# Patient Record
Sex: Male | Born: 1995 | Hispanic: No | Smoking: Never smoker
Health system: Southern US, Community
[De-identification: ages and names within clinical notes are randomized; demographics above are authoritative.]

---
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
## 2016-01-02 DIAGNOSIS — S83422D Sprain of lateral collateral ligament of left knee, subsequent encounter: Secondary | ICD-10-CM | POA: Diagnosis not present

## 2016-01-04 DIAGNOSIS — M25662 Stiffness of left knee, not elsewhere classified: Secondary | ICD-10-CM | POA: Diagnosis not present

## 2016-01-04 DIAGNOSIS — S8332XD Tear of articular cartilage of left knee, current, subsequent encounter: Secondary | ICD-10-CM | POA: Diagnosis not present

## 2016-01-04 DIAGNOSIS — M25562 Pain in left knee: Secondary | ICD-10-CM | POA: Diagnosis not present

## 2016-01-04 DIAGNOSIS — S83422D Sprain of lateral collateral ligament of left knee, subsequent encounter: Secondary | ICD-10-CM | POA: Diagnosis not present

## 2016-01-13 DIAGNOSIS — S83422D Sprain of lateral collateral ligament of left knee, subsequent encounter: Secondary | ICD-10-CM | POA: Diagnosis not present

## 2016-01-13 DIAGNOSIS — R262 Difficulty in walking, not elsewhere classified: Secondary | ICD-10-CM | POA: Diagnosis not present

## 2016-01-13 DIAGNOSIS — M25662 Stiffness of left knee, not elsewhere classified: Secondary | ICD-10-CM | POA: Diagnosis not present

## 2016-01-13 DIAGNOSIS — M25562 Pain in left knee: Secondary | ICD-10-CM | POA: Diagnosis not present

## 2016-01-20 DIAGNOSIS — M25662 Stiffness of left knee, not elsewhere classified: Secondary | ICD-10-CM | POA: Diagnosis not present

## 2016-01-20 DIAGNOSIS — M25562 Pain in left knee: Secondary | ICD-10-CM | POA: Diagnosis not present

## 2016-01-20 DIAGNOSIS — S8332XD Tear of articular cartilage of left knee, current, subsequent encounter: Secondary | ICD-10-CM | POA: Diagnosis not present

## 2016-01-20 DIAGNOSIS — S83422D Sprain of lateral collateral ligament of left knee, subsequent encounter: Secondary | ICD-10-CM | POA: Diagnosis not present

## 2016-01-25 DIAGNOSIS — M25562 Pain in left knee: Secondary | ICD-10-CM | POA: Diagnosis not present

## 2016-01-25 DIAGNOSIS — S8332XD Tear of articular cartilage of left knee, current, subsequent encounter: Secondary | ICD-10-CM | POA: Diagnosis not present

## 2016-01-25 DIAGNOSIS — M25662 Stiffness of left knee, not elsewhere classified: Secondary | ICD-10-CM | POA: Diagnosis not present

## 2016-01-25 DIAGNOSIS — S83422D Sprain of lateral collateral ligament of left knee, subsequent encounter: Secondary | ICD-10-CM | POA: Diagnosis not present

## 2016-01-30 DIAGNOSIS — M25562 Pain in left knee: Secondary | ICD-10-CM | POA: Diagnosis not present

## 2016-02-02 DIAGNOSIS — M25662 Stiffness of left knee, not elsewhere classified: Secondary | ICD-10-CM | POA: Diagnosis not present

## 2016-02-02 DIAGNOSIS — S83422D Sprain of lateral collateral ligament of left knee, subsequent encounter: Secondary | ICD-10-CM | POA: Diagnosis not present

## 2016-02-02 DIAGNOSIS — S8332XD Tear of articular cartilage of left knee, current, subsequent encounter: Secondary | ICD-10-CM | POA: Diagnosis not present

## 2016-02-02 DIAGNOSIS — M25562 Pain in left knee: Secondary | ICD-10-CM | POA: Diagnosis not present

## 2016-02-08 DIAGNOSIS — M25662 Stiffness of left knee, not elsewhere classified: Secondary | ICD-10-CM | POA: Diagnosis not present

## 2016-02-08 DIAGNOSIS — M25562 Pain in left knee: Secondary | ICD-10-CM | POA: Diagnosis not present

## 2016-02-08 DIAGNOSIS — S83422D Sprain of lateral collateral ligament of left knee, subsequent encounter: Secondary | ICD-10-CM | POA: Diagnosis not present

## 2016-02-08 DIAGNOSIS — S8332XD Tear of articular cartilage of left knee, current, subsequent encounter: Secondary | ICD-10-CM | POA: Diagnosis not present

## 2016-02-15 DIAGNOSIS — R262 Difficulty in walking, not elsewhere classified: Secondary | ICD-10-CM | POA: Diagnosis not present

## 2016-02-15 DIAGNOSIS — S83422D Sprain of lateral collateral ligament of left knee, subsequent encounter: Secondary | ICD-10-CM | POA: Diagnosis not present

## 2016-02-15 DIAGNOSIS — M25562 Pain in left knee: Secondary | ICD-10-CM | POA: Diagnosis not present

## 2016-02-15 DIAGNOSIS — M25662 Stiffness of left knee, not elsewhere classified: Secondary | ICD-10-CM | POA: Diagnosis not present

## 2016-02-26 DIAGNOSIS — B9689 Other specified bacterial agents as the cause of diseases classified elsewhere: Secondary | ICD-10-CM | POA: Diagnosis not present

## 2016-02-26 DIAGNOSIS — J329 Chronic sinusitis, unspecified: Secondary | ICD-10-CM | POA: Diagnosis not present

## 2016-07-31 ENCOUNTER — Encounter (HOSPITAL_COMMUNITY): Payer: Self-pay | Admitting: Emergency Medicine

## 2016-07-31 ENCOUNTER — Ambulatory Visit (HOSPITAL_COMMUNITY)
Admission: EM | Admit: 2016-07-31 | Discharge: 2016-07-31 | Disposition: A | Discharge status: Home / Self Care | Payer: BLUE CROSS/BLUE SHIELD | Attending: Emergency Medicine | Admitting: Emergency Medicine

## 2016-07-31 DIAGNOSIS — R05 Cough: Secondary | ICD-10-CM

## 2016-07-31 DIAGNOSIS — J069 Acute upper respiratory infection, unspecified: Secondary | ICD-10-CM | POA: Insufficient documentation

## 2016-07-31 DIAGNOSIS — J00 Acute nasopharyngitis [common cold]: Secondary | ICD-10-CM

## 2016-07-31 DIAGNOSIS — R0982 Postnasal drip: Secondary | ICD-10-CM | POA: Diagnosis not present

## 2016-07-31 DIAGNOSIS — R059 Cough, unspecified: Secondary | ICD-10-CM

## 2016-07-31 LAB — POCT URINALYSIS DIP (DEVICE)
BILIRUBIN URINE: NEGATIVE
Glucose, UA: NEGATIVE mg/dL
HGB URINE DIPSTICK: NEGATIVE
Ketones, ur: NEGATIVE mg/dL
LEUKOCYTES UA: NEGATIVE
NITRITE: NEGATIVE
Protein, ur: NEGATIVE mg/dL
Specific Gravity, Urine: 1.025 (ref 1.005–1.030)
Urobilinogen, UA: 1 mg/dL (ref 0.0–1.0)
pH: 6 (ref 5.0–8.0)

## 2016-07-31 LAB — POCT RAPID STREP A: STREPTOCOCCUS, GROUP A SCREEN (DIRECT): NEGATIVE

## 2016-07-31 MED ORDER — IPRATROPIUM BROMIDE 0.06 % NA SOLN: 2.0000 | Freq: Four times a day (QID) | NASAL | 0 refills | Status: DC

## 2016-07-31 MED ORDER — ALBUTEROL SULFATE HFA 108 (90 BASE) MCG/ACT IN AERS: 2.0000 | INHALATION_SPRAY | RESPIRATORY_TRACT | 0 refills | Status: DC | PRN

## 2016-07-31 NOTE — ED Provider Notes (Signed)
CSN: [id number]     Arrival date & time 07/31/16  1046 History   First MD Initiated Contact with Patient 07/31/16 1256     Chief Complaint  Patient presents with  . URI   (Consider location/radiation/quality/duration/timing/severity/associated sxs/prior Treatment) 20 year old male is accompanied by his mother after having a one-week history of URI symptoms. He was seen in the local infirmary at school and tested negative for flu and strep. His mother and patient are both concerned about his persistent symptoms. He was initially taking Tylenol only and for a day or  Benadryl and was told to stop taking that by the infirmary persons. He continues to have a hacking cough, runny nose, PND, sore throat and occasionally having blood-tinged mucus with coughing. He is also concerned about his urine being darker orange even though he is drinking more fluids. His mother had several concerns regarding individual symptoms of cold, medications, and combination of medications. This conversation lasted for over 20 minutes in the exam room.      History reviewed. No pertinent past medical history. History reviewed. No pertinent surgical history. History reviewed. No pertinent family history. Social History  Substance Use Topics  . Smoking status: Never Smoker  . Smokeless tobacco: Never Used  . Alcohol use No    Review of Systems  Constitutional: Positive for activity change, appetite change and fever. Negative for diaphoresis and fatigue.       He believes he may have had a fever but has not measured it, no thermometer.  HENT: Positive for congestion, postnasal drip, rhinorrhea, sore throat and trouble swallowing. Negative for ear pain and facial swelling.   Eyes: Negative for pain, discharge and redness.  Respiratory: Positive for cough. Negative for chest tightness, shortness of breath and wheezing.   Cardiovascular: Negative.   Gastrointestinal: Negative.   Genitourinary: Negative for  discharge, dysuria, frequency and penile pain.  Musculoskeletal: Negative.  Negative for neck pain and neck stiffness.  Skin: Negative for rash and wound.  Neurological: Negative.   Psychiatric/Behavioral: Negative.   All other systems reviewed and are negative.   Allergies  Patient has no known allergies.  Home Medications   Prior to Admission medications   Medication Sig Start Date End Date Taking? Authorizing Provider  albuterol (PROVENTIL HFA;VENTOLIN HFA) 108 (90 Base) MCG/ACT inhaler Inhale 2 puffs into the lungs every 4 (four) hours as needed for wheezing or shortness of breath. 07/31/16   Hayden Rasmussenavid Karishma Unrein, NP  CEPHALEXIN PO Take 1 capsule by mouth.    Historical Provider, MD  ipratropium (ATROVENT) 0.06 % nasal spray Place 2 sprays into both nostrils 4 (four) times daily. As needed constant runny nose 07/31/16   Hayden Rasmussenavid Basia Mcginty, NP   Meds Ordered and Administered this Visit  Medications - No data to display  BP 112/63 (BP Location: Left Arm)   Pulse 71   Temp 99.3 F (37.4 C) (Oral)   SpO2 99%  No data found.   Physical Exam  Constitutional: He is oriented to person, place, and time. He appears well-developed and well-nourished. No distress.  HENT:  Head: Normocephalic and atraumatic.  Mouth/Throat: Oropharynx is clear and moist. No oropharyngeal exudate.  Bilateral TMs are normal. Oropharynx with clear PND minor cobblestoning but no exudates. Airway is widely patent.  Eyes: EOM are normal.  Neck: Normal range of motion. Neck supple.  Cardiovascular: Normal rate, regular rhythm and normal heart sounds.   Pulmonary/Chest: Effort normal and breath sounds normal. No respiratory distress. He has no wheezes. He  has no rales.  Good chest expansion and good air movement.  Musculoskeletal: Normal range of motion. He exhibits no edema.  Lymphadenopathy:    He has no cervical adenopathy.  Neurological: He is alert and oriented to person, place, and time.  Skin: Skin is warm and dry.  No rash noted.  Psychiatric: He has a normal mood and affect.  Nursing note and vitals reviewed.   Urgent Care Course   Clinical Course     Procedures (including critical care time)  Labs Review Labs Reviewed  POCT URINALYSIS DIP (DEVICE)  POCT RAPID STREP A    Imaging Review No results found.   Visual Acuity Review  Right Eye Distance:   Left Eye Distance:   Bilateral Distance:    Right Eye Near:   Left Eye Near:    Bilateral Near:         MDM   1. Acute nasopharyngitis   2. PND (post-nasal drip)   3. Cough    Sudafed PE 10 mg every 4 to 6 hours as needed for congestion Allegra or Zyrtec daily as needed for drainage and runny nose. For stronger antihistamine may take Chlor-Trimeton 2 to 4 mg every 4 to 6 hours, may cause drowsiness. Saline nasal spray used frequently. Ibuprofen 600 mg every 6 hours as needed for pain, discomfort or fever. Drink plenty of fluids and stay well-hydrated.  Meds ordered this encounter  Medications  . albuterol (PROVENTIL HFA;VENTOLIN HFA) 108 (90 Base) MCG/ACT inhaler    Sig: Inhale 2 puffs into the lungs every 4 (four) hours as needed for wheezing or shortness of breath.    Dispense:  1 Inhaler    Refill:  0    Order Specific Question:   Supervising Provider    Answer:   Charm RingsHONIG, ERIN J [id number]  . ipratropium (ATROVENT) 0.06 % nasal spray    Sig: Place 2 sprays into both nostrils 4 (four) times daily. As needed constant runny nose    Dispense:  15 mL    Refill:  0    Order Specific Question:   Supervising Provider    Answer:   Micheline ChapmanHONIG, ERIN J [[id number]]       Hayden Rasmussenavid Sione Baumgarten, NP 07/31/16 [phone]

## 2016-07-31 NOTE — ED Triage Notes (Signed)
Pt has been suffering from nasal congestion, sore throat and cough for one week.  He was seen a the school infirm[facility] and was tested for flu and strep, which were both negative.  Sunday he reports having blood in his phlegm which is getting progressively worse and he also noted having blood in his nasal drainage yesterday.

## 2016-07-31 NOTE — Discharge Instructions (Signed)
Sudafed PE 10 mg every 4 to 6 hours as needed for congestion °Allegra or Zyrtec daily as needed for drainage and runny nose. °For stronger antihistamine may take Chlor-Trimeton 2 to 4 mg every 4 to 6 hours, may cause drowsiness. °Saline nasal spray used frequently. °Ibuprofen 600 mg every 6 hours as needed for pain, discomfort or fever. °Drink plenty of fluids and stay well-hydrated. °

## 2016-08-03 LAB — CULTURE, GROUP A STREP (THRC)

## 2016-08-28 DIAGNOSIS — Z23 Encounter for immunization: Secondary | ICD-10-CM | POA: Diagnosis not present

## 2016-09-12 DIAGNOSIS — J329 Chronic sinusitis, unspecified: Secondary | ICD-10-CM | POA: Diagnosis not present

## 2016-09-12 DIAGNOSIS — B9689 Other specified bacterial agents as the cause of diseases classified elsewhere: Secondary | ICD-10-CM | POA: Diagnosis not present

## 2017-01-25 DIAGNOSIS — Z0000 Encounter for general adult medical examination without abnormal findings: Secondary | ICD-10-CM | POA: Diagnosis not present

## 2017-01-25 DIAGNOSIS — L700 Acne vulgaris: Secondary | ICD-10-CM | POA: Diagnosis not present

## 2017-01-25 DIAGNOSIS — L308 Other specified dermatitis: Secondary | ICD-10-CM | POA: Diagnosis not present

## 2017-01-25 DIAGNOSIS — Z6829 Body mass index (BMI) 29.0-29.9, adult: Secondary | ICD-10-CM | POA: Diagnosis not present

## 2017-04-23 DIAGNOSIS — L738 Other specified follicular disorders: Secondary | ICD-10-CM | POA: Diagnosis not present

## 2017-06-12 DIAGNOSIS — F4323 Adjustment disorder with mixed anxiety and depressed mood: Secondary | ICD-10-CM | POA: Diagnosis not present

## 2017-06-13 DIAGNOSIS — F4323 Adjustment disorder with mixed anxiety and depressed mood: Secondary | ICD-10-CM | POA: Diagnosis not present

## 2017-06-21 DIAGNOSIS — F4323 Adjustment disorder with mixed anxiety and depressed mood: Secondary | ICD-10-CM | POA: Diagnosis not present

## 2017-07-12 DIAGNOSIS — F339 Major depressive disorder, recurrent, unspecified: Secondary | ICD-10-CM | POA: Diagnosis not present

## 2017-07-15 DIAGNOSIS — F339 Major depressive disorder, recurrent, unspecified: Secondary | ICD-10-CM | POA: Diagnosis not present

## 2017-07-15 DIAGNOSIS — R319 Hematuria, unspecified: Secondary | ICD-10-CM | POA: Diagnosis not present

## 2017-07-17 DIAGNOSIS — F339 Major depressive disorder, recurrent, unspecified: Secondary | ICD-10-CM | POA: Diagnosis not present

## 2017-07-18 DIAGNOSIS — F339 Major depressive disorder, recurrent, unspecified: Secondary | ICD-10-CM | POA: Diagnosis not present

## 2017-07-22 DIAGNOSIS — F339 Major depressive disorder, recurrent, unspecified: Secondary | ICD-10-CM | POA: Diagnosis not present

## 2017-07-24 DIAGNOSIS — F339 Major depressive disorder, recurrent, unspecified: Secondary | ICD-10-CM | POA: Diagnosis not present

## 2017-08-05 DIAGNOSIS — F4010 Social phobia, unspecified: Secondary | ICD-10-CM | POA: Diagnosis not present

## 2017-08-06 DIAGNOSIS — F339 Major depressive disorder, recurrent, unspecified: Secondary | ICD-10-CM | POA: Diagnosis not present

## 2017-08-07 DIAGNOSIS — F339 Major depressive disorder, recurrent, unspecified: Secondary | ICD-10-CM | POA: Diagnosis not present

## 2017-08-08 DIAGNOSIS — F4010 Social phobia, unspecified: Secondary | ICD-10-CM | POA: Diagnosis not present

## 2017-08-12 DIAGNOSIS — F339 Major depressive disorder, recurrent, unspecified: Secondary | ICD-10-CM | POA: Diagnosis not present

## 2017-08-14 DIAGNOSIS — F339 Major depressive disorder, recurrent, unspecified: Secondary | ICD-10-CM | POA: Diagnosis not present

## 2017-08-16 DIAGNOSIS — F339 Major depressive disorder, recurrent, unspecified: Secondary | ICD-10-CM | POA: Diagnosis not present

## 2017-08-23 DIAGNOSIS — F339 Major depressive disorder, recurrent, unspecified: Secondary | ICD-10-CM | POA: Diagnosis not present

## 2017-08-28 DIAGNOSIS — F339 Major depressive disorder, recurrent, unspecified: Secondary | ICD-10-CM | POA: Diagnosis not present

## 2017-09-06 DIAGNOSIS — F339 Major depressive disorder, recurrent, unspecified: Secondary | ICD-10-CM | POA: Diagnosis not present

## 2017-09-11 DIAGNOSIS — F339 Major depressive disorder, recurrent, unspecified: Secondary | ICD-10-CM | POA: Diagnosis not present

## 2017-09-18 DIAGNOSIS — F339 Major depressive disorder, recurrent, unspecified: Secondary | ICD-10-CM | POA: Diagnosis not present

## 2017-09-21 DIAGNOSIS — L03314 Cellulitis of groin: Secondary | ICD-10-CM | POA: Diagnosis not present

## 2017-09-25 DIAGNOSIS — F339 Major depressive disorder, recurrent, unspecified: Secondary | ICD-10-CM | POA: Diagnosis not present

## 2017-09-26 DIAGNOSIS — F339 Major depressive disorder, recurrent, unspecified: Secondary | ICD-10-CM | POA: Diagnosis not present

## 2017-10-01 DIAGNOSIS — F339 Major depressive disorder, recurrent, unspecified: Secondary | ICD-10-CM | POA: Diagnosis not present

## 2017-10-03 DIAGNOSIS — F339 Major depressive disorder, recurrent, unspecified: Secondary | ICD-10-CM | POA: Diagnosis not present

## 2017-10-08 DIAGNOSIS — F339 Major depressive disorder, recurrent, unspecified: Secondary | ICD-10-CM | POA: Diagnosis not present

## 2017-12-30 DIAGNOSIS — J020 Streptococcal pharyngitis: Secondary | ICD-10-CM | POA: Diagnosis not present

## 2018-01-06 DIAGNOSIS — Z113 Encounter for screening for infections with a predominantly sexual mode of transmission: Secondary | ICD-10-CM | POA: Diagnosis not present

## 2018-01-06 DIAGNOSIS — F4010 Social phobia, unspecified: Secondary | ICD-10-CM | POA: Diagnosis not present

## 2018-01-06 DIAGNOSIS — F321 Major depressive disorder, single episode, moderate: Secondary | ICD-10-CM | POA: Diagnosis not present

## 2018-08-05 DIAGNOSIS — S93492A Sprain of other ligament of left ankle, initial encounter: Secondary | ICD-10-CM | POA: Diagnosis not present

## 2018-08-11 DIAGNOSIS — F339 Major depressive disorder, recurrent, unspecified: Secondary | ICD-10-CM | POA: Diagnosis not present

## 2018-08-18 DIAGNOSIS — F339 Major depressive disorder, recurrent, unspecified: Secondary | ICD-10-CM | POA: Diagnosis not present

## 2018-08-22 DIAGNOSIS — Z0000 Encounter for general adult medical examination without abnormal findings: Secondary | ICD-10-CM | POA: Diagnosis not present

## 2018-08-22 DIAGNOSIS — S8011XA Contusion of right lower leg, initial encounter: Secondary | ICD-10-CM | POA: Diagnosis not present

## 2018-08-25 DIAGNOSIS — F339 Major depressive disorder, recurrent, unspecified: Secondary | ICD-10-CM | POA: Diagnosis not present

## 2018-08-27 DIAGNOSIS — M25572 Pain in left ankle and joints of left foot: Secondary | ICD-10-CM | POA: Diagnosis not present

## 2018-08-27 DIAGNOSIS — M25672 Stiffness of left ankle, not elsewhere classified: Secondary | ICD-10-CM | POA: Diagnosis not present

## 2018-08-27 DIAGNOSIS — Z1389 Encounter for screening for other disorder: Secondary | ICD-10-CM | POA: Diagnosis not present

## 2018-08-27 DIAGNOSIS — M6281 Muscle weakness (generalized): Secondary | ICD-10-CM | POA: Diagnosis not present

## 2018-08-27 DIAGNOSIS — Z6832 Body mass index (BMI) 32.0-32.9, adult: Secondary | ICD-10-CM | POA: Diagnosis not present

## 2018-08-27 DIAGNOSIS — Z0000 Encounter for general adult medical examination without abnormal findings: Secondary | ICD-10-CM | POA: Diagnosis not present

## 2018-08-27 DIAGNOSIS — Z23 Encounter for immunization: Secondary | ICD-10-CM | POA: Diagnosis not present

## 2018-08-27 DIAGNOSIS — S93402D Sprain of unspecified ligament of left ankle, subsequent encounter: Secondary | ICD-10-CM | POA: Diagnosis not present

## 2018-08-28 DIAGNOSIS — S93492D Sprain of other ligament of left ankle, subsequent encounter: Secondary | ICD-10-CM | POA: Diagnosis not present

## 2018-09-01 DIAGNOSIS — F339 Major depressive disorder, recurrent, unspecified: Secondary | ICD-10-CM | POA: Diagnosis not present

## 2018-09-08 DIAGNOSIS — M25561 Pain in right knee: Secondary | ICD-10-CM | POA: Diagnosis not present

## 2018-09-08 DIAGNOSIS — S8001XD Contusion of right knee, subsequent encounter: Secondary | ICD-10-CM | POA: Diagnosis not present

## 2018-09-08 DIAGNOSIS — M25661 Stiffness of right knee, not elsewhere classified: Secondary | ICD-10-CM | POA: Diagnosis not present

## 2018-09-15 DIAGNOSIS — F339 Major depressive disorder, recurrent, unspecified: Secondary | ICD-10-CM | POA: Diagnosis not present

## 2018-09-15 DIAGNOSIS — M6281 Muscle weakness (generalized): Secondary | ICD-10-CM | POA: Diagnosis not present

## 2018-09-15 DIAGNOSIS — M25572 Pain in left ankle and joints of left foot: Secondary | ICD-10-CM | POA: Diagnosis not present

## 2018-09-15 DIAGNOSIS — S93402D Sprain of unspecified ligament of left ankle, subsequent encounter: Secondary | ICD-10-CM | POA: Diagnosis not present

## 2018-09-15 DIAGNOSIS — M25372 Other instability, left ankle: Secondary | ICD-10-CM | POA: Diagnosis not present

## 2018-09-17 DIAGNOSIS — M25661 Stiffness of right knee, not elsewhere classified: Secondary | ICD-10-CM | POA: Diagnosis not present

## 2018-09-17 DIAGNOSIS — M25561 Pain in right knee: Secondary | ICD-10-CM | POA: Diagnosis not present

## 2018-09-17 DIAGNOSIS — S8001XD Contusion of right knee, subsequent encounter: Secondary | ICD-10-CM | POA: Diagnosis not present

## 2018-09-22 DIAGNOSIS — F339 Major depressive disorder, recurrent, unspecified: Secondary | ICD-10-CM | POA: Diagnosis not present

## 2018-09-29 DIAGNOSIS — F339 Major depressive disorder, recurrent, unspecified: Secondary | ICD-10-CM | POA: Diagnosis not present

## 2018-10-06 DIAGNOSIS — F339 Major depressive disorder, recurrent, unspecified: Secondary | ICD-10-CM | POA: Diagnosis not present

## 2018-10-08 DIAGNOSIS — F331 Major depressive disorder, recurrent, moderate: Secondary | ICD-10-CM | POA: Diagnosis not present

## 2018-10-14 DIAGNOSIS — F331 Major depressive disorder, recurrent, moderate: Secondary | ICD-10-CM | POA: Diagnosis not present

## 2018-10-15 DIAGNOSIS — F331 Major depressive disorder, recurrent, moderate: Secondary | ICD-10-CM | POA: Diagnosis not present

## 2018-10-21 DIAGNOSIS — F331 Major depressive disorder, recurrent, moderate: Secondary | ICD-10-CM | POA: Diagnosis not present

## 2018-10-24 DIAGNOSIS — F331 Major depressive disorder, recurrent, moderate: Secondary | ICD-10-CM | POA: Diagnosis not present

## 2018-10-27 DIAGNOSIS — F331 Major depressive disorder, recurrent, moderate: Secondary | ICD-10-CM | POA: Diagnosis not present

## 2018-10-30 DIAGNOSIS — F331 Major depressive disorder, recurrent, moderate: Secondary | ICD-10-CM | POA: Diagnosis not present

## 2018-11-03 DIAGNOSIS — H6120 Impacted cerumen, unspecified ear: Secondary | ICD-10-CM | POA: Diagnosis not present

## 2018-11-03 DIAGNOSIS — H6691 Otitis media, unspecified, right ear: Secondary | ICD-10-CM | POA: Diagnosis not present

## 2018-11-03 DIAGNOSIS — F331 Major depressive disorder, recurrent, moderate: Secondary | ICD-10-CM | POA: Diagnosis not present

## 2018-11-03 DIAGNOSIS — H8113 Benign paroxysmal vertigo, bilateral: Secondary | ICD-10-CM | POA: Diagnosis not present

## 2018-11-04 DIAGNOSIS — H6120 Impacted cerumen, unspecified ear: Secondary | ICD-10-CM | POA: Diagnosis not present

## 2018-11-04 DIAGNOSIS — H8113 Benign paroxysmal vertigo, bilateral: Secondary | ICD-10-CM | POA: Diagnosis not present

## 2018-11-06 DIAGNOSIS — F331 Major depressive disorder, recurrent, moderate: Secondary | ICD-10-CM | POA: Diagnosis not present

## 2018-11-10 DIAGNOSIS — F331 Major depressive disorder, recurrent, moderate: Secondary | ICD-10-CM | POA: Diagnosis not present

## 2018-11-12 ENCOUNTER — Ambulatory Visit (INDEPENDENT_AMBULATORY_CARE_PROVIDER_SITE_OTHER): Payer: BLUE CROSS/BLUE SHIELD | Admitting: Psychiatry

## 2018-11-12 ENCOUNTER — Other Ambulatory Visit: Payer: Self-pay

## 2018-11-12 ENCOUNTER — Encounter: Payer: Self-pay | Admitting: Psychiatry

## 2018-11-12 VITALS — BP 134/86 | HR 83 | Ht 68.0 in | Wt 215.0 lb

## 2018-11-12 DIAGNOSIS — F411 Generalized anxiety disorder: Secondary | ICD-10-CM

## 2018-11-12 DIAGNOSIS — F1010 Alcohol abuse, uncomplicated: Secondary | ICD-10-CM

## 2018-11-12 DIAGNOSIS — F4010 Social phobia, unspecified: Secondary | ICD-10-CM

## 2018-11-12 DIAGNOSIS — F331 Major depressive disorder, recurrent, moderate: Secondary | ICD-10-CM

## 2018-11-12 DIAGNOSIS — F341 Dysthymic disorder: Secondary | ICD-10-CM

## 2018-11-12 MED ORDER — FLUOXETINE HCL 10 MG PO CAPS: ORAL_CAPSULE | ORAL | 1 refills | Status: DC

## 2018-11-12 NOTE — Patient Instructions (Signed)
Reduce Lexapro to 1/2 tablet and add 10 mg 1 capsule of fluoxetine for 1 week. Then stop Lexapro and increase fluoxetine to 2 of the 10 mg capsules

## 2018-11-12 NOTE — Progress Notes (Signed)
[facility] MD/PA/NP Initial Note  11/12/2018 12:45 PM [name]  MRN:  [id number]  Chief Complaint:  Chief Complaint    Other; Anxiety; Depression; Medication Problem      HPI:  Referred by Dr. Helyn App says For a long time depression and anxiety from a young age.  Started treatment fall 2018.  Attends school in [place] but taking the semester off and needed someone here.  Hasn't seen his psychiatrist in almost a year partly over money.  Been on Lexapro 10 since April 2019.  A lot happened since then.  Pretty fine right now.  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  Lexapro is love hate thing.  It makes him very drowsy and needs to work out more.  Excessive naps and irregular sleep.  Not fatigue.  A lot of depression happened with adoption and identity issues.  Drank to cope.  Then sister died.    Less anxious with the lexapro.  More easily black out with alcohol on Lexapro.  Never had blackouts with alcohol before Lexapro.  Friends said personality changes when he drank but functioned fine.  Tolerance of Lexapro and alcohol changed.  Didn't even get a buzz but all the sudden would get drunk.  Wants another option.  Lexapro kind of numbs and dulls him emotionally and his decision making.  Don't feel guilty as much as he used to.  In the past was very guilt prone.  Pt reports that mood is Anxious and describes anxiety as Moderate but variable and situational and worse than it was initially on Lexapro. Anxiety symptoms include: Excessive Worry,. Anxiety meeting new people.  Some intrusive repetitive anxious thoughts afraid he'll mess up in conversation.  Biggest obsession is trying to read the mind of girls.  Trying to read their emotions or what they think of him.  Pt reports no sleep issues but sleeping 10-12 hours. Pt reports that appetite is good. Pt reports that energy is poor and good. Concentration is good. Suicidal thoughts:  Passive fleeting thoughts come and  go.  No serious SI ever.  Taking this semester off from school bc [place] is a bad environment for him and a lot of stressors and work on himself.  Sister died 11-24-2018from colon cancer.  Drinks socially and binges at times like college kids do.  Mostly on weekends.  Has gone weeks without it.  No craving.  No marijuana nor other drugs.  1 panic after sister died.  And about 2 weeks after she died had a period of anxiety freedom and no filter and more confident and fast speech and super charming.  Danced in the airport and never would have done that before.  Lasted about 2 weeks.  So carefree.  No bad decisions he recalls. No risk behaviors.  Never had this before nor since.  Visit Diagnosis:    ICD-10-CM   1. Major depressive disorder, recurrent episode, moderate (HCC) F33.1   2. Generalized anxiety disorder F41.1   3. Social anxiety disorder F40.10   4. Dysthymia F34.1   5. 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  No other psych med history.  Past Medical History: History reviewed. No pertinent past medical history. History reviewed. No pertinent surgical history.  Family Psychiatric History: adopted no medical info on biological family.  Family History:  Family History  Adopted: Yes    Social History:  Adopted at 18 mos.  Junior at [employer]  Good grades. Social History   Socioeconomic History  . Marital status: Unknown    Spouse name: Not on file  . Number of children: Not on file  . Years of education: Not on file  . Highest education level: Not on file  Occupational History  . Not on file  Social Needs  . Financial resource strain: Not on file  . Food insecurity:    Worry: Not on file    Inability: Not on file  . Transportation needs:    Medical: Not on file    Non-medical: Not on file  Tobacco Use  . Smoking status: Never Smoker  . Smokeless tobacco: Never Used  Substance  and Sexual Activity  . Alcohol use: No  . Drug use: No  . Sexual activity: Not on file  Lifestyle  . Physical activity:    Days per week: Not on file    Minutes per session: Not on file  . Stress: Not on file  Relationships  . Social connections:    Talks on phone: Not on file    Gets together: Not on file    Attends religious service: Not on file    Active member of club or organization: Not on file    Attends meetings of clubs or organizations: Not on file    Relationship status: Not on file  Other Topics Concern  . Not on file  Social History Narrative  . Not on file    Allergies: No Known Allergies  Metabolic Disorder Labs: No results found for: HGBA1C, MPG No results found for: PROLACTIN No results found for: CHOL, TRIG, HDL, CHOLHDL, VLDL, LDLCALC No results found for: TSH  Therapeutic Level Labs: No results found for: LITHIUM No results found for: VALPROATE No components found for:  CBMZ  Current Medications: Current Outpatient Medications  Medication Sig Dispense Refill  . cefdinir (OMNICEF) 300 MG capsule Take 300 mg by mouth 2 (two) times daily.    . Creatine Monohydrate POWD Take by mouth.    . Dietary Management Product (NEOKE BCAA4) POWD Take by mouth.    . Linoleic Acid-Sunflower Oil (CLA) [phone] MG CAPS Take by mouth.    Marland Kitchen FLUoxetine (PROZAC) 10 MG capsule 1 capsule daily for 1 week then 2 capsules daily 60 capsule 1   No current facility-administered medications for this visit.     Medication Side Effects: as noted above  Orders placed this visit:  No orders of the defined types were placed in this encounter.   Psychiatric Specialty Exam:  Review of Systems  Constitutional: Positive for diaphoresis and malaise/fatigue. Negative for chills, fever and weight loss.  HENT: Positive for ear pain. Negative for congestion, ear discharge, hearing loss, nosebleeds, sinus pain, sore throat and tinnitus.   Eyes: Positive for blurred vision. Negative  for double vision, photophobia, pain and discharge.  Respiratory: Negative for cough, hemoptysis, sputum production, shortness of breath, wheezing and stridor.   Cardiovascular: Negative for chest pain, palpitations, orthopnea and claudication.  Gastrointestinal: Negative for abdominal pain, blood in stool, constipation, diarrhea, heartburn, nausea and vomiting.  Genitourinary: Negative for dysuria, flank pain, frequency, hematuria and urgency.  Musculoskeletal: Positive for back pain.  Skin: Negative for itching and rash.  Neurological: Positive for dizziness and weakness.    Blood pressure 134/86, pulse 83, height 5\' 8"  (1.727 m), weight 215 lb (97.5 kg).Body mass index is 32.69 kg/m.  General Appearance: Casual and overweight  Eye Contact:  Good  Speech:  Clear and Coherent and Normal Rate  Volume:  Normal  Mood:  Anxious and variable  Affect:  Appropriate and Full Range  Thought Process:  Coherent and Goal Directed  Orientation:  Full (Time, Place, and Person)  Thought Content: Logical   Suicidal Thoughts:  No  Homicidal Thoughts:  No  Memory:  WNL  Judgement:  Fair  Insight:  Good  Psychomotor Activity:  Normal  Concentration:  Concentration: Good  Recall:  Good  Fund of Knowledge: Good  Language: Good  Assets:  Communication Skills Desire for Improvement Housing Intimacy Leisure Time Physical Health Social Support Talents/Skills Transportation Vocational/Educational  ADL's:  Intact  Cognition: WNL  Prognosis:  Good   Screenings:  MDQ with some positives as noted.  Receiving Psychotherapy: Dr. Ave Filter  Treatment Plan/Recommendations:  Greater than 50% of face to face time with patient was spent on counseling and coordination of care. We discussed Patient presents with longstanding depression and anxiety consistent with diagnosis of major depression social anxiety and generalized anxiety and likely dysthymia.  He had a 2-week period of what appears to be hypomanic  type symptoms but this was immediately following the death of his sister and he linked the 2 together.  Because he is adopted we do not not know anything about his biological family as to whether there is a predisposition to bipolar disorder.  His early onset depression is a risk factor.  We discussed the signs and symptoms of mania and hypomania in detail and the risks that antidepressants can trigger them.  He is to contact us if he has any such symptoms.  However taken within context it seems likely that his symptoms which were not severe were a type of grief reaction.  We discussed the side effects he is having with Lexapro.  SSRIs are the usual choice for depression with anxiety but he is too sedated from Lexapro.  He appears to be somewhat medication sensitive as well.  We will switch him to a less sedating SSRI and we did discussed Best 2 options fluoxetine and sertraline.  Disc risk SI in young people with meds.  We discussed other side effects in detail.  He agreed with the switch to fluoxetine which probably has less risk of the fatigue then does sertraline.  He is to call us if he has any side effects with the switch in medicines.  Reduce Lexapro to 1/2 tablet and add 10 mg 1 capsule of fluoxetine for 1 week. Then stop Lexapro and increase fluoxetine to 2 of the 10 mg capsules  We discussed the risk of drinking alcohol with the medications and this may interfere with the effectiveness.  He does not appear to be drinking frequently and less so now.  However we discussed the excessive use when he does drink and that he needs to moderate that.  We discussed the dangers of blackouts and obviously not driving after drinking.  He says he will moderate his drinking.  [place] OCD inventory today  This was 1 hour appointment  Follow-up 6 weeks  Lauraine Rinne, MD

## 2018-11-13 DIAGNOSIS — F331 Major depressive disorder, recurrent, moderate: Secondary | ICD-10-CM | POA: Diagnosis not present

## 2018-11-17 DIAGNOSIS — F331 Major depressive disorder, recurrent, moderate: Secondary | ICD-10-CM | POA: Diagnosis not present

## 2018-11-20 DIAGNOSIS — F331 Major depressive disorder, recurrent, moderate: Secondary | ICD-10-CM | POA: Diagnosis not present

## 2018-11-24 DIAGNOSIS — F531 Puerperal psychosis: Secondary | ICD-10-CM | POA: Diagnosis not present

## 2018-11-27 DIAGNOSIS — D225 Melanocytic nevi of trunk: Secondary | ICD-10-CM | POA: Diagnosis not present

## 2018-11-27 DIAGNOSIS — D2261 Melanocytic nevi of right upper limb, including shoulder: Secondary | ICD-10-CM | POA: Diagnosis not present

## 2018-11-27 DIAGNOSIS — D224 Melanocytic nevi of scalp and neck: Secondary | ICD-10-CM | POA: Diagnosis not present

## 2018-11-27 DIAGNOSIS — D2239 Melanocytic nevi of other parts of face: Secondary | ICD-10-CM | POA: Diagnosis not present

## 2018-11-27 DIAGNOSIS — F531 Puerperal psychosis: Secondary | ICD-10-CM | POA: Diagnosis not present

## 2018-12-01 DIAGNOSIS — F531 Puerperal psychosis: Secondary | ICD-10-CM | POA: Diagnosis not present

## 2018-12-04 DIAGNOSIS — K5909 Other constipation: Secondary | ICD-10-CM | POA: Diagnosis not present

## 2018-12-04 DIAGNOSIS — Z6834 Body mass index (BMI) 34.0-34.9, adult: Secondary | ICD-10-CM | POA: Diagnosis not present

## 2018-12-04 DIAGNOSIS — K625 Hemorrhage of anus and rectum: Secondary | ICD-10-CM | POA: Diagnosis not present

## 2018-12-04 DIAGNOSIS — F531 Puerperal psychosis: Secondary | ICD-10-CM | POA: Diagnosis not present

## 2018-12-08 DIAGNOSIS — F531 Puerperal psychosis: Secondary | ICD-10-CM | POA: Diagnosis not present

## 2018-12-11 DIAGNOSIS — F531 Puerperal psychosis: Secondary | ICD-10-CM | POA: Diagnosis not present

## 2018-12-15 DIAGNOSIS — F331 Major depressive disorder, recurrent, moderate: Secondary | ICD-10-CM | POA: Diagnosis not present

## 2018-12-18 DIAGNOSIS — F331 Major depressive disorder, recurrent, moderate: Secondary | ICD-10-CM | POA: Diagnosis not present

## 2018-12-22 DIAGNOSIS — F331 Major depressive disorder, recurrent, moderate: Secondary | ICD-10-CM | POA: Diagnosis not present

## 2018-12-24 ENCOUNTER — Ambulatory Visit (INDEPENDENT_AMBULATORY_CARE_PROVIDER_SITE_OTHER): Payer: BLUE CROSS/BLUE SHIELD | Admitting: Psychiatry

## 2018-12-24 ENCOUNTER — Encounter: Payer: Self-pay | Admitting: Psychiatry

## 2018-12-24 ENCOUNTER — Other Ambulatory Visit: Payer: Self-pay

## 2018-12-24 DIAGNOSIS — F411 Generalized anxiety disorder: Secondary | ICD-10-CM

## 2018-12-24 DIAGNOSIS — F341 Dysthymic disorder: Secondary | ICD-10-CM | POA: Diagnosis not present

## 2018-12-24 DIAGNOSIS — F331 Major depressive disorder, recurrent, moderate: Secondary | ICD-10-CM

## 2018-12-24 DIAGNOSIS — F4010 Social phobia, unspecified: Secondary | ICD-10-CM

## 2018-12-24 DIAGNOSIS — F1010 Alcohol abuse, uncomplicated: Secondary | ICD-10-CM

## 2018-12-24 MED ORDER — FLUOXETINE HCL 20 MG PO CAPS: 20.0000 mg | ORAL_CAPSULE | Freq: Every day | ORAL | 0 refills | Status: DC

## 2018-12-24 NOTE — Progress Notes (Signed)
Connor AhrJoseph Collier Alejandro Bonenfant [id number] 1995/10/08 23 y.o.  Subjective:   Patient ID:  Connor Hill is a 23 y.o. (DOB 1995/10/08) male.  Chief Complaint:  Chief Complaint  Patient presents with  . Follow-up    Medication Management    HPI Connor Hill presents to the office today for follow-up of anxiety and depressive symptoms.  Last seen as his initial visit November 12, 2018 referred by Dr. Andi Henceeagan.  He was having side effects from Lexapro and we switched him to fluoxetine 20 mg daily.  For the most part good.  With switch had some anxiety worsen.  Hard to judge bc isolated.  Is less drowsy.  Pt reports that mood is Anxious, Depressed and less blunted vs Lexapro and depression is residual and maybe worse bc less blunted.  describes anxiety as Minimal. Anxiety symptoms include: Excessive Worry,. Pt reports no sleep issues. Pt reports that appetite is good. Pt reports that energy is improved and good and improved. Concentration is good and no change. Suicidal thoughts:  denied by patient.  Not drinking DT quarantine.  Past Psychiatric Medication Trials: Lexapro   Review of Systems:  Review of Systems  Neurological: Negative for tremors and weakness.    Medications: I have reviewed the patient's current medications.  Current Outpatient Medications  Medication Sig Dispense Refill  . Creatine Monohydrate POWD Take by mouth.    . Dietary Management Product (NEOKE BCAA4) POWD Take by mouth.    Marland Kitchen. FLUoxetine (PROZAC) 20 MG capsule Take 1 capsule (20 mg total) by mouth daily. 90 capsule 0  . Linoleic Acid-Sunflower Oil (CLA) [phone] MG CAPS Take by mouth.     No current facility-administered medications for this visit.     Medication Side Effects: None except insomnia if takes it late.  Allergies: No Known Allergies  History reviewed. No pertinent past medical history.  Family History  Adopted: Yes    Social History   Socioeconomic  History  . Marital status: Unknown    Spouse name: Not on file  . Number of children: Not on file  . Years of education: Not on file  . Highest education level: Not on file  Occupational History  . Not on file  Social Needs  . Financial resource strain: Not on file  . Food insecurity:    Worry: Not on file    Inability: Not on file  . Transportation needs:    Medical: Not on file    Non-medical: Not on file  Tobacco Use  . Smoking status: Never Smoker  . Smokeless tobacco: Never Used  Substance and Sexual Activity  . Alcohol use: No  . Drug use: No  . Sexual activity: Not on file  Lifestyle  . Physical activity:    Days per week: Not on file    Minutes per session: Not on file  . Stress: Not on file  Relationships  . Social connections:    Talks on phone: Not on file    Gets together: Not on file    Attends religious service: Not on file    Active member of club or organization: Not on file    Attends meetings of clubs or organizations: Not on file    Relationship status: Not on file  . Intimate partner violence:    Fear of current or ex partner: Not on file    Emotionally abused: Not on file    Physically abused: Not on file    Forced sexual  activity: Not on file  Other Topics Concern  . Not on file  Social History Narrative  . Not on file    Past Medical History, Surgical history, Social history, and Family history were reviewed and updated as appropriate.   Please see review of systems for further details on the patient's review from today.   Objective:   Physical Exam:  There were no vitals taken for this visit.  Physical Exam Neurological:     Mental Status: He is alert and oriented to person, place, and time.     Cranial Nerves: No dysarthria.  Psychiatric:        Attention and Perception: Attention normal. He does not perceive auditory hallucinations.        Mood and Affect: Mood is anxious and depressed.        Speech: Speech normal.         Behavior: Behavior is cooperative.        Thought Content: Thought content normal. Thought content is not paranoid or delusional. Thought content does not include homicidal or suicidal ideation. Thought content does not include homicidal or suicidal plan.        Cognition and Memory: Cognition and memory normal.        Judgment: Judgment normal.     Comments: Insight fair.     Lab Review:  No results found for: NA, K, CL, CO2, GLUCOSE, BUN, CREATININE, CALCIUM, PROT, ALBUMIN, AST, ALT, ALKPHOS, BILITOT, GFRNONAA, GFRAA  No results found for: WBC, RBC, HGB, HCT, PLT, MCV, MCH, MCHC, RDW, LYMPHSABS, MONOABS, EOSABS, BASOSABS  No results found for: POCLITH, LITHIUM   No results found for: PHENYTOIN, PHENOBARB, VALPROATE, CBMZ   .res Assessment: Plan:    Major depressive disorder, recurrent episode, moderate (HCC)  Generalized anxiety disorder  Social anxiety disorder  Dysthymia  Engages in binge consumption of alcohol   We have switched him from Lexapro to fluoxetine in hopes of reducing side effects of sleepiness and some artifacts related to drinking alcohol with the Lexapro.  At this point he is doing okay but is very difficult to judge about the anxiety because he is isolated and a lot of his anxiety is social anxiety.  Still some residual depression.  He is tolerating the fluoxetine well  Because of the Covid 19 virus and isolation results is just difficult to judge the effect of the medication.  If he experiences more anxiety as he begins to transition back into normal routine or if the depression is not well managed he can call between now and the next visit will increase the fluoxetine from 20 mg to 30 or 40 mg.  He is aware of this.  Continue fluoxetine 20 mg daily  Follow-up 8 weeks.  I connected with patient by a video enabled telemedicine application or telephone, with their informed consent, and verified patient privacy and that I am speaking with the correct person  using two identifiers.  I was located at office and patient at home.  Meredith Staggers, MD, DFAPA   Please see After Visit Summary for patient specific instructions.  No future appointments.  No orders of the defined types were placed in this encounter.     -------------------------------

## 2018-12-25 DIAGNOSIS — F331 Major depressive disorder, recurrent, moderate: Secondary | ICD-10-CM | POA: Diagnosis not present

## 2018-12-29 DIAGNOSIS — F331 Major depressive disorder, recurrent, moderate: Secondary | ICD-10-CM | POA: Diagnosis not present

## 2019-01-01 DIAGNOSIS — F331 Major depressive disorder, recurrent, moderate: Secondary | ICD-10-CM | POA: Diagnosis not present

## 2019-01-03 ENCOUNTER — Other Ambulatory Visit: Payer: Self-pay | Admitting: Psychiatry

## 2019-01-05 DIAGNOSIS — F331 Major depressive disorder, recurrent, moderate: Secondary | ICD-10-CM | POA: Diagnosis not present

## 2019-01-08 DIAGNOSIS — F331 Major depressive disorder, recurrent, moderate: Secondary | ICD-10-CM | POA: Diagnosis not present

## 2019-01-12 DIAGNOSIS — F331 Major depressive disorder, recurrent, moderate: Secondary | ICD-10-CM | POA: Diagnosis not present

## 2019-01-15 DIAGNOSIS — F331 Major depressive disorder, recurrent, moderate: Secondary | ICD-10-CM | POA: Diagnosis not present

## 2019-01-19 DIAGNOSIS — F331 Major depressive disorder, recurrent, moderate: Secondary | ICD-10-CM | POA: Diagnosis not present

## 2019-01-22 DIAGNOSIS — F331 Major depressive disorder, recurrent, moderate: Secondary | ICD-10-CM | POA: Diagnosis not present

## 2019-01-28 DIAGNOSIS — F331 Major depressive disorder, recurrent, moderate: Secondary | ICD-10-CM | POA: Diagnosis not present

## 2019-02-02 DIAGNOSIS — F331 Major depressive disorder, recurrent, moderate: Secondary | ICD-10-CM | POA: Diagnosis not present

## 2019-02-05 DIAGNOSIS — F331 Major depressive disorder, recurrent, moderate: Secondary | ICD-10-CM | POA: Diagnosis not present

## 2019-02-10 DIAGNOSIS — F331 Major depressive disorder, recurrent, moderate: Secondary | ICD-10-CM | POA: Diagnosis not present

## 2019-02-13 DIAGNOSIS — F331 Major depressive disorder, recurrent, moderate: Secondary | ICD-10-CM | POA: Diagnosis not present

## 2019-02-19 DIAGNOSIS — F331 Major depressive disorder, recurrent, moderate: Secondary | ICD-10-CM | POA: Diagnosis not present

## 2019-02-23 ENCOUNTER — Encounter: Payer: Self-pay | Admitting: Psychiatry

## 2019-02-23 ENCOUNTER — Other Ambulatory Visit: Payer: Self-pay

## 2019-02-23 ENCOUNTER — Ambulatory Visit: Payer: BC Managed Care – PPO | Admitting: Psychiatry

## 2019-02-23 DIAGNOSIS — F341 Dysthymic disorder: Secondary | ICD-10-CM

## 2019-02-23 DIAGNOSIS — F411 Generalized anxiety disorder: Secondary | ICD-10-CM | POA: Diagnosis not present

## 2019-02-23 DIAGNOSIS — F4010 Social phobia, unspecified: Secondary | ICD-10-CM | POA: Diagnosis not present

## 2019-02-23 DIAGNOSIS — F331 Major depressive disorder, recurrent, moderate: Secondary | ICD-10-CM

## 2019-02-23 MED ORDER — FLUOXETINE HCL 10 MG PO CAPS: 30.0000 mg | ORAL_CAPSULE | Freq: Every day | ORAL | 2 refills | Status: DC

## 2019-02-23 NOTE — Progress Notes (Signed)
Avant Printy [id number] [birth year] 23 y.o.  Subjective:   Patient ID:  Connor Hill is a 23 y.o. (DOB 1995-10-14) male.  Chief Complaint:  Chief Complaint  Patient presents with  . Follow-up    Medication Management  . Anxiety    Medication Management    HPI Connor Hill presents to the office today for follow-up of anxiety and depressive symptoms.  Last seen April.  No changes after recent switch to fluoxetine from Lexapro.    Wants to try higher fluoxetine to be sure he won't be drowsy like he was with Lexapro.  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    He and Dr. Enis Gash no longer think he's depressed.  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  Going out a little more lately and feeling a little social anxiety and it's worse.  For the most part good.  With switch had some anxiety worsen.  Hard to judge bc isolated.  Is less drowsy.  Pt reports that mood is anxious a bit more with situation but depression is betterand for the most part resolved..   Anxiety symptoms include: Excessive Worry,. Pt reports no sleep issues. Pt reports that appetite is good. Pt reports that energy is improved and good and improved. Concentration is good and no change. Suicidal thoughts:  denied by patient.  Not drinking DT quarantine.  Past alcohol issues with some elevation liver enzymes but he's cut back.  Past Psychiatric Medication Trials: Lexapro sleepy   Review of Systems:  Review of Systems  Neurological: Negative for tremors and weakness.    Medications: I have reviewed the patient's current medications.  Current Outpatient Medications  Medication Sig Dispense Refill  . Creatine Monohydrate POWD Take by mouth.    . Dietary Management Product (NEOKE BCAA4) POWD Take by mouth.    Marland Kitchen FLUoxetine (PROZAC) 20 MG capsule Take 1 capsule (20 mg total) by mouth daily. 90 capsule 0  . Linoleic  Acid-Sunflower Oil (CLA) [phone] MG CAPS Take by mouth.     No current facility-administered medications for this visit.     Medication Side Effects: None except insomnia if takes it late.  Allergies: No Known Allergies  History reviewed. No pertinent past medical history.  Family History  Adopted: Yes    Social History   Socioeconomic History  . Marital status: Unknown    Spouse name: Not on file  . Number of children: Not on file  . Years of education: Not on file  . Highest education level: Not on file  Occupational History  . Not on file  Social Needs  . Financial resource strain: Not on file  . Food insecurity    Worry: Not on file    Inability: Not on file  . Transportation needs    Medical: Not on file    Non-medical: Not on file  Tobacco Use  . Smoking status: Never Smoker  . Smokeless tobacco: Never Used  Substance and Sexual Activity  . Alcohol use: No  . Drug use: No  . Sexual activity: Not on file  Lifestyle  . Physical activity    Days per week: Not on file    Minutes per session: Not on file  . Stress: Not on file  Relationships  . Social Herbalist on phone: Not on file    Gets together: Not on file    Attends religious service: Not on  file    Active member of club or organization: Not on file    Attends meetings of clubs or organizations: Not on file    Relationship status: Not on file  . Intimate partner violence    Fear of current or ex partner: Not on file    Emotionally abused: Not on file    Physically abused: Not on file    Forced sexual activity: Not on file  Other Topics Concern  . Not on file  Social History Narrative  . Not on file    Past Medical History, Surgical history, Social history, and Family history were reviewed and updated as appropriate.   Please see review of systems for further details on the patient's review from today.   Objective:   Physical Exam:  There were no vitals taken for this  visit.  Physical Exam Constitutional:      General: He is not in acute distress.    Appearance: He is well-developed.  Musculoskeletal:        General: No deformity.  Neurological:     Mental Status: He is alert and oriented to person, place, and time.     Coordination: Coordination normal.  Psychiatric:        Attention and Perception: Attention normal. He is attentive.        Mood and Affect: Mood is anxious. Mood is not depressed. Affect is not labile, blunt, angry or inappropriate.        Speech: Speech normal.        Behavior: Behavior normal.        Thought Content: Thought content normal. Thought content does not include homicidal or suicidal ideation. Thought content does not include homicidal or suicidal plan.        Cognition and Memory: Cognition normal.        Judgment: Judgment normal.     Comments: Insight is good.     Lab Review:  No results found for: NA, K, CL, CO2, GLUCOSE, BUN, CREATININE, CALCIUM, PROT, ALBUMIN, AST, ALT, ALKPHOS, BILITOT, GFRNONAA, GFRAA  No results found for: WBC, RBC, HGB, HCT, PLT, MCV, MCH, MCHC, RDW, LYMPHSABS, MONOABS, EOSABS, BASOSABS  No results found for: POCLITH, LITHIUM   No results found for: PHENYTOIN, PHENOBARB, VALPROATE, CBMZ   .res Assessment: Plan:    Connor Hill was seen today for follow-up and anxiety.  Diagnoses and all orders for this visit:  Social anxiety disorder  Major depressive disorder, recurrent episode, moderate (HCC)  Generalized anxiety disorder  Dysthymia  We have switched him from Lexapro to fluoxetine in hopes of reducing side effects of sleepiness and some artifacts related to drinking alcohol with the Lexapro.  At this point he is doing okay but is very difficult to judge about the anxiety because he is isolated and a lot of his anxiety is social anxiety.  Still some residual depression.  He is tolerating the fluoxetine well  Because of the Covid 19 virus and isolation results is just difficult to  judge the effect of the medication.  If he experiences more anxiety as he begins to transition back into normal routine or if the depression is not well managed he can call between now and the next visit will increase the fluoxetine from 20 mg to 30 or 40 mg.  He is aware of this.  increase fluoxetine 10 mg 3 daily  [place] [employer] [place]  Disc risk of liver disease and alcohol use and answered his  questions. Last liver enzymes in Spring may repeat once Covid dies down.  Follow-up 8 weeks.  Meredith Staggersarey Cottle, MD, DFAPA   Please see After Visit Summary for patient specific instructions.  No future appointments.  No orders of the defined types were placed in this encounter.     -------------------------------

## 2019-02-27 DIAGNOSIS — F331 Major depressive disorder, recurrent, moderate: Secondary | ICD-10-CM | POA: Diagnosis not present

## 2019-03-03 DIAGNOSIS — K625 Hemorrhage of anus and rectum: Secondary | ICD-10-CM | POA: Diagnosis not present

## 2019-03-03 DIAGNOSIS — K5900 Constipation, unspecified: Secondary | ICD-10-CM | POA: Diagnosis not present

## 2019-03-04 DIAGNOSIS — F331 Major depressive disorder, recurrent, moderate: Secondary | ICD-10-CM | POA: Diagnosis not present

## 2019-03-12 DIAGNOSIS — R748 Abnormal levels of other serum enzymes: Secondary | ICD-10-CM | POA: Diagnosis not present

## 2019-03-12 DIAGNOSIS — K5900 Constipation, unspecified: Secondary | ICD-10-CM | POA: Diagnosis not present

## 2019-03-12 DIAGNOSIS — K625 Hemorrhage of anus and rectum: Secondary | ICD-10-CM | POA: Diagnosis not present

## 2019-03-13 DIAGNOSIS — F331 Major depressive disorder, recurrent, moderate: Secondary | ICD-10-CM | POA: Diagnosis not present

## 2019-03-18 ENCOUNTER — Other Ambulatory Visit: Payer: Self-pay | Admitting: Psychiatry

## 2019-03-20 DIAGNOSIS — F331 Major depressive disorder, recurrent, moderate: Secondary | ICD-10-CM | POA: Diagnosis not present

## 2019-03-23 DIAGNOSIS — R748 Abnormal levels of other serum enzymes: Secondary | ICD-10-CM | POA: Diagnosis not present

## 2019-03-26 DIAGNOSIS — F331 Major depressive disorder, recurrent, moderate: Secondary | ICD-10-CM | POA: Diagnosis not present

## 2019-04-02 DIAGNOSIS — F331 Major depressive disorder, recurrent, moderate: Secondary | ICD-10-CM | POA: Diagnosis not present

## 2019-04-16 DIAGNOSIS — F331 Major depressive disorder, recurrent, moderate: Secondary | ICD-10-CM | POA: Diagnosis not present

## 2019-04-17 DIAGNOSIS — K625 Hemorrhage of anus and rectum: Secondary | ICD-10-CM | POA: Diagnosis not present

## 2019-04-17 DIAGNOSIS — K640 First degree hemorrhoids: Secondary | ICD-10-CM | POA: Diagnosis not present

## 2019-04-24 ENCOUNTER — Encounter: Payer: Self-pay | Admitting: Psychiatry

## 2019-04-24 ENCOUNTER — Other Ambulatory Visit: Payer: Self-pay

## 2019-04-24 ENCOUNTER — Ambulatory Visit (INDEPENDENT_AMBULATORY_CARE_PROVIDER_SITE_OTHER): Payer: BC Managed Care – PPO | Admitting: Psychiatry

## 2019-04-24 ENCOUNTER — Encounter

## 2019-04-24 DIAGNOSIS — F1010 Alcohol abuse, uncomplicated: Secondary | ICD-10-CM

## 2019-04-24 DIAGNOSIS — F341 Dysthymic disorder: Secondary | ICD-10-CM | POA: Diagnosis not present

## 2019-04-24 DIAGNOSIS — F4010 Social phobia, unspecified: Secondary | ICD-10-CM

## 2019-04-24 DIAGNOSIS — F411 Generalized anxiety disorder: Secondary | ICD-10-CM | POA: Diagnosis not present

## 2019-04-24 DIAGNOSIS — F331 Major depressive disorder, recurrent, moderate: Secondary | ICD-10-CM | POA: Diagnosis not present

## 2019-04-24 MED ORDER — PROPRANOLOL HCL 20 MG PO TABS: 20.0000 mg | ORAL_TABLET | Freq: Two times a day (BID) | ORAL | 0 refills | Status: DC | PRN

## 2019-04-24 MED ORDER — FLUOXETINE HCL 20 MG PO CAPS: 40.0000 mg | ORAL_CAPSULE | Freq: Every day | ORAL | 0 refills | Status: DC

## 2019-04-24 NOTE — Progress Notes (Signed)
Connor Hill Bena [id number] 1995/11/03 23 y.o.  Virtual Visit via Telephone Note  I connected with pt by telephone and verified that I am speaking with the correct person using two identifiers.   I discussed the limitations, risks, security and privacy concerns of performing an evaluation and management service by telephone and the availability of in person appointments. I also discussed with the patient that there may be a patient responsible charge related to this service. The patient expressed understanding and agreed to proceed.  I discussed the assessment and treatment plan with the patient. The patient was provided an opportunity to ask questions and all were answered. The patient agreed with the plan and demonstrated an understanding of the instructions.   The patient was advised to call back or seek an in-person evaluation if the symptoms worsen or if the condition fails to improve as anticipated.  I provided 15 minutes of non-face-to-face time during this encounter. The call started at 1145 and ended at noon. The patient was located at home and the provider was located office.  Subjective:   Patient ID:  Connor Hill Connor Hill is a 23 y.o. (DOB 1995/11/03) male.  Chief Complaint:  Chief Complaint  Patient presents with  . Anxiety    Medication management  . Follow-up    Depression and med changes    Anxiety     Connor Hill Connor Hill presents to the office today for follow-up of anxiety and depressive symptoms.  At visit in April.  No changes after recent switch to fluoxetine from Lexapro.    Last visit in June we increased fluoxetine for anxiety and residual depression from 20 to 30 mg daily.  Very beneficial.  No depression.  Anxiety is helpful but is not gone.  Was super awkward at social gathering last night.  Some mild physical symptoms associated with the social anxiety.  Wonders about another increase.  A little bit of sweating  and a little heart racing.  He tolerated the increase in fluoxetine without side effects. Wants to try higher fluoxetine to be sure he won't be drowsy like he was with Lexapro.     Pt reports no sleep issues. Pt reports that appetite is good. Pt reports that energy is improved and good and improved. Concentration is good and no change. Suicidal thoughts:  denied by patient.  Not drinking DT quarantine.  Past alcohol issues with some elevation liver enzymes but he's cut back.  [place] [employer] [place].  Past Psychiatric Medication Trials: Lexapro sleepy, fluoxetine 40    Review of Systems:  Review of Systems  Neurological: Negative for tremors and weakness.    Medications: I have reviewed the patient's current medications.  Current Outpatient Medications  Medication Sig Dispense Refill  . Creatine Monohydrate POWD Take by mouth.    . Dietary Management Product (NEOKE BCAA4) POWD Take by mouth.    Marland Kitchen. FLUoxetine (PROZAC) 20 MG capsule Take 2 capsules (40 mg total) by mouth daily. 180 capsule 0  . Linoleic Acid-Sunflower Oil (CLA) [phone] MG CAPS Take by mouth.    . propranolol (INDERAL) 20 MG tablet Take 1-2 tablets (20-40 mg total) by mouth 2 (two) times daily as needed. 100 tablet 0   No current facility-administered medications for this visit.     Medication Side Effects: None except insomnia if takes it late.  Allergies: No Known Allergies  History reviewed. No pertinent past medical history.  Family History  Adopted: Yes  Social History   Socioeconomic History  . Marital status: Unknown    Spouse name: Not on file  . Number of children: Not on file  . Years of education: Not on file  . Highest education level: Not on file  Occupational History  . Not on file  Social Needs  . Financial resource strain: Not on file  . Food insecurity    Worry: Not on file    Inability: Not on file  . Transportation needs    Medical: Not on file    Non-medical:  Not on file  Tobacco Use  . Smoking status: Never Smoker  . Smokeless tobacco: Never Used  Substance and Sexual Activity  . Alcohol use: No  . Drug use: No  . Sexual activity: Not on file  Lifestyle  . Physical activity    Days per week: Not on file    Minutes per session: Not on file  . Stress: Not on file  Relationships  . Social Musicianconnections    Talks on phone: Not on file    Gets together: Not on file    Attends religious service: Not on file    Active member of club or organization: Not on file    Attends meetings of clubs or organizations: Not on file    Relationship status: Not on file  . Intimate partner violence    Fear of current or ex partner: Not on file    Emotionally abused: Not on file    Physically abused: Not on file    Forced sexual activity: Not on file  Other Topics Concern  . Not on file  Social History Narrative  . Not on file    Past Medical History, Surgical history, Social history, and Family history were reviewed and updated as appropriate.   Please see review of systems for further details on the patient's review from today.   Objective:   Physical Exam:  There were no vitals taken for this visit.  Physical Exam Constitutional:      General: He is not in acute distress.    Appearance: He is well-developed.  Musculoskeletal:        General: No deformity.  Neurological:     Mental Status: He is alert and oriented to person, place, and time.     Coordination: Coordination normal.  Psychiatric:        Attention and Perception: Attention normal. He is attentive.        Mood and Affect: Mood is anxious. Mood is not depressed. Affect is not labile, blunt, angry or inappropriate.        Speech: Speech normal.        Behavior: Behavior normal.        Thought Content: Thought content normal. Thought content does not include homicidal or suicidal ideation. Thought content does not include homicidal or suicidal plan.        Cognition and Memory:  Cognition normal.        Judgment: Judgment normal.     Comments: Insight is good.     Lab Review:  No results found for: NA, K, CL, CO2, GLUCOSE, BUN, CREATININE, CALCIUM, PROT, ALBUMIN, AST, ALT, ALKPHOS, BILITOT, GFRNONAA, GFRAA  No results found for: WBC, RBC, HGB, HCT, PLT, MCV, MCH, MCHC, RDW, LYMPHSABS, MONOABS, EOSABS, BASOSABS  No results found for: POCLITH, LITHIUM   No results found for: PHENYTOIN, PHENOBARB, VALPROATE, CBMZ   .res Assessment: Plan:    Jomarie LongsJoseph was seen today for anxiety  and follow-up.  Diagnoses and all orders for this visit:  Major depressive disorder, recurrent episode, moderate (HCC) -     FLUoxetine (PROZAC) 20 MG capsule; Take 2 capsules (40 mg total) by mouth daily.  Social anxiety disorder -     FLUoxetine (PROZAC) 20 MG capsule; Take 2 capsules (40 mg total) by mouth daily. -     propranolol (INDERAL) 20 MG tablet; Take 1-2 tablets (20-40 mg total) by mouth 2 (two) times daily as needed.  Generalized anxiety disorder -     FLUoxetine (PROZAC) 20 MG capsule; Take 2 capsules (40 mg total) by mouth daily.  Dysthymia -     FLUoxetine (PROZAC) 20 MG capsule; Take 2 capsules (40 mg total) by mouth daily.  Engages in binge consumption of alcohol  We have switched him from Lexapro to fluoxetine in hopes of reducing side effects of sleepiness and some artifacts related to drinking alcohol with the Lexapro.  His depression has resolved with the fluoxetine and is not having any sleepiness.  He still has residual anxiety and wants to increase the fluoxetine to see if it is further improved.  He is tolerating the fluoxetine well  increase fluoxetine 40 mg for residual anxiety  Prn propranolol 20-40 mg social events to see if it helps.  Discussed side effects of each medicine at length.  He would like to try the propranolol.  Will return to [place] in mid August  Disc risk of liver disease and alcohol use and answered his questions. Last liver  enzymes in Spring may repeat once Covid dies down.  Follow-up Christmas break or earlier can do a tele-visit if his symptoms are not under good control.  He will notify us if his depressive and anxiety symptoms are interfering with his academic performance or quality of life.  Lynder Parents, MD, DFAPA   Please see After Visit Summary for patient specific instructions.  No future appointments.  No orders of the defined types were placed in this encounter.     -------------------------------

## 2019-04-29 DIAGNOSIS — F331 Major depressive disorder, recurrent, moderate: Secondary | ICD-10-CM | POA: Diagnosis not present

## 2019-05-06 DIAGNOSIS — F331 Major depressive disorder, recurrent, moderate: Secondary | ICD-10-CM | POA: Diagnosis not present

## 2019-05-13 DIAGNOSIS — F331 Major depressive disorder, recurrent, moderate: Secondary | ICD-10-CM | POA: Diagnosis not present

## 2019-05-14 ENCOUNTER — Other Ambulatory Visit: Payer: Self-pay | Admitting: Psychiatry

## 2019-05-14 DIAGNOSIS — F4010 Social phobia, unspecified: Secondary | ICD-10-CM

## 2019-05-14 NOTE — Telephone Encounter (Signed)
Left pt. A VM to return my call.

## 2019-05-14 NOTE — Telephone Encounter (Signed)
Can you check and see if he's taking that frequently, just filled 2 weeks ago

## 2019-05-15 ENCOUNTER — Other Ambulatory Visit: Payer: Self-pay | Admitting: Psychiatry

## 2019-05-15 DIAGNOSIS — F331 Major depressive disorder, recurrent, moderate: Secondary | ICD-10-CM

## 2019-05-15 DIAGNOSIS — F411 Generalized anxiety disorder: Secondary | ICD-10-CM

## 2019-05-15 DIAGNOSIS — F341 Dysthymic disorder: Secondary | ICD-10-CM

## 2019-05-15 DIAGNOSIS — F4010 Social phobia, unspecified: Secondary | ICD-10-CM

## 2019-05-15 NOTE — Telephone Encounter (Signed)
Tried calling pt. Again. Left VM to return my call.

## 2019-05-20 DIAGNOSIS — F331 Major depressive disorder, recurrent, moderate: Secondary | ICD-10-CM | POA: Diagnosis not present

## 2019-05-27 DIAGNOSIS — F331 Major depressive disorder, recurrent, moderate: Secondary | ICD-10-CM | POA: Diagnosis not present

## 2019-06-10 DIAGNOSIS — F331 Major depressive disorder, recurrent, moderate: Secondary | ICD-10-CM | POA: Diagnosis not present

## 2019-06-17 DIAGNOSIS — F331 Major depressive disorder, recurrent, moderate: Secondary | ICD-10-CM | POA: Diagnosis not present

## 2019-07-01 DIAGNOSIS — F331 Major depressive disorder, recurrent, moderate: Secondary | ICD-10-CM | POA: Diagnosis not present

## 2019-07-07 DIAGNOSIS — F331 Major depressive disorder, recurrent, moderate: Secondary | ICD-10-CM | POA: Diagnosis not present

## 2019-07-16 DIAGNOSIS — F331 Major depressive disorder, recurrent, moderate: Secondary | ICD-10-CM | POA: Diagnosis not present

## 2019-07-18 ENCOUNTER — Other Ambulatory Visit: Payer: Self-pay | Admitting: Psychiatry

## 2019-07-18 DIAGNOSIS — F341 Dysthymic disorder: Secondary | ICD-10-CM

## 2019-07-18 DIAGNOSIS — F331 Major depressive disorder, recurrent, moderate: Secondary | ICD-10-CM

## 2019-07-18 DIAGNOSIS — F411 Generalized anxiety disorder: Secondary | ICD-10-CM

## 2019-07-18 DIAGNOSIS — F4010 Social phobia, unspecified: Secondary | ICD-10-CM

## 2019-07-22 DIAGNOSIS — F331 Major depressive disorder, recurrent, moderate: Secondary | ICD-10-CM | POA: Diagnosis not present

## 2019-07-29 DIAGNOSIS — F331 Major depressive disorder, recurrent, moderate: Secondary | ICD-10-CM | POA: Diagnosis not present

## 2019-08-07 DIAGNOSIS — Z20828 Contact with and (suspected) exposure to other viral communicable diseases: Secondary | ICD-10-CM | POA: Diagnosis not present

## 2019-08-07 DIAGNOSIS — F331 Major depressive disorder, recurrent, moderate: Secondary | ICD-10-CM | POA: Diagnosis not present

## 2019-08-13 DIAGNOSIS — F331 Major depressive disorder, recurrent, moderate: Secondary | ICD-10-CM | POA: Diagnosis not present

## 2019-08-18 ENCOUNTER — Other Ambulatory Visit: Payer: Self-pay

## 2019-08-18 ENCOUNTER — Ambulatory Visit (INDEPENDENT_AMBULATORY_CARE_PROVIDER_SITE_OTHER): Payer: BC Managed Care – PPO | Admitting: Psychiatry

## 2019-08-18 ENCOUNTER — Encounter: Payer: Self-pay | Admitting: Psychiatry

## 2019-08-18 DIAGNOSIS — F4010 Social phobia, unspecified: Secondary | ICD-10-CM

## 2019-08-18 DIAGNOSIS — F341 Dysthymic disorder: Secondary | ICD-10-CM | POA: Diagnosis not present

## 2019-08-18 DIAGNOSIS — F411 Generalized anxiety disorder: Secondary | ICD-10-CM | POA: Diagnosis not present

## 2019-08-18 DIAGNOSIS — F331 Major depressive disorder, recurrent, moderate: Secondary | ICD-10-CM | POA: Diagnosis not present

## 2019-08-18 NOTE — Progress Notes (Signed)
Connor AhrJoseph Collier Alejandro Bayman [id number] 07/25/1996 23 y.o.   Subjective:   Patient ID:  Connor Hill is a 23 y.o. (DOB 07/25/1996) male.  Chief Complaint:  Chief Complaint  Patient presents with  . Follow-up    anxiety, depresssion, med changes    Anxiety     Connor AhrJoseph Collier Alejandro Reitz presents to the office today for follow-up of anxiety and depressive symptoms.  At visit in April.  No changes after recent switch to fluoxetine from Lexapro.    Last visit in June we increased fluoxetine for anxiety and residual depression from 20 to 30 mg daily.  This was helpful. Last seen August 2020.  The following changes were made:  increase fluoxetine 40 mg for residual anxiety  Prn propranolol 20-40 mg social events to see if it helps.  Discussed side effects of each medicine at length.  He would like to try the propranolol.  [place] [employer] [place] in [place].  Pretty good overall.  Tried propranolol prn for social settings.  Alcohol with it seemed to increase effects of alcohol.  It did help with social anxiety.    Questions about the meds and medical concerns.  Liver enzymes a little elevated.    Hasn't gotten grades.  Only concern about 1 of the classes which was hard.  Increase fluoxetine from 20 to 30 mg huge benefit but 30 to 40 mg less sig benefit  But without SE. Very beneficial. Still No depression.  Anxiety is better but not gone socially Not much other anxiety except some general.  Occ intrusive images of bad things happening. These are better with time also.  A little bit of sweating and a little heart racing.  He tolerated the increase in fluoxetine without side effects. Wants to try higher fluoxetine to be sure he won't be drowsy like he was with Lexapro.     Pt reports no sleep issues. Pt reports that appetite is good. Pt reports that energy is improved and good and improved. Concentration is good and no change.  Suicidal thoughts:  denied by patient.  Not drinking DT quarantine.  Past alcohol issues with some elevation liver enzymes but he's cut back.   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  2 semesters left to graduate.   Past Psychiatric Medication Trials: Lexapro sleepy, fluoxetine 40 , propranolol  Review of Systems:  Review of Systems  Neurological: Negative for tremors and weakness.    Medications: I have reviewed the patient's current medications.  Current Outpatient Medications  Medication Sig Dispense Refill  . Creatine Monohydrate POWD Take by mouth.    . Dietary Management Product (NEOKE BCAA4) POWD Take by mouth.    Marland Kitchen. FLUoxetine (PROZAC) 20 MG capsule TAKE 2 CAPSULES BY MOUTH EVERY DAY 180 capsule 0  . Linoleic Acid-Sunflower Oil (CLA) [phone] MG CAPS Take by mouth.    . propranolol (INDERAL) 20 MG tablet TAKE 1-2 TABLETS (20-40 MG TOTAL) BY MOUTH 2 (TWO) TIMES DAILY AS NEEDED. 100 tablet 0   No current facility-administered medications for this visit.     Medication Side Effects: None except insomnia if takes it late.  Allergies: No Known Allergies  History reviewed. No pertinent past medical history.  Family History  Adopted: Yes    Social History   Socioeconomic History  . Marital status: Unknown    Spouse name: Not on file  . Number of children: Not on file  . Years of education: Not on file  . Highest  education level: Not on file  Occupational History  . Not on file  Social Needs  . Financial resource strain: Not on file  . Food insecurity    Worry: Not on file    Inability: Not on file  . Transportation needs    Medical: Not on file    Non-medical: Not on file  Tobacco Use  . Smoking status: Never Smoker  . Smokeless tobacco: Never Used  Substance and Sexual Activity  . Alcohol use: No  . Drug use: No  . Sexual activity: Not on file  Lifestyle  . Physical activity    Days per week: Not on file    Minutes per session: Not on file  . Stress: Not on file  Relationships   . Social Musician on phone: Not on file    Gets together: Not on file    Attends religious service: Not on file    Active member of club or organization: Not on file    Attends meetings of clubs or organizations: Not on file    Relationship status: Not on file  . Intimate partner violence    Fear of current or ex partner: Not on file    Emotionally abused: Not on file    Physically abused: Not on file    Forced sexual activity: Not on file  Other Topics Concern  . Not on file  Social History Narrative  . Not on file    Past Medical History, Surgical history, Social history, and Family history were reviewed and updated as appropriate.   Please see review of systems for further details on the patient's review from today.   Objective:   Physical Exam:  There were no vitals taken for this visit.  Physical Exam Constitutional:      General: He is not in acute distress.    Appearance: He is well-developed.  Musculoskeletal:        General: No deformity.  Neurological:     Mental Status: He is alert and oriented to person, place, and time.     Coordination: Coordination normal.  Psychiatric:        Attention and Perception: Attention normal. He is attentive.        Mood and Affect: Mood is anxious. Mood is not depressed. Affect is not labile, blunt, angry or inappropriate.        Speech: Speech normal.        Behavior: Behavior normal.        Thought Content: Thought content normal. Thought content does not include homicidal or suicidal ideation. Thought content does not include homicidal or suicidal plan.        Cognition and Memory: Cognition normal.        Judgment: Judgment normal.     Comments: Insight is good.     Lab Review:  No results found for: NA, K, CL, CO2, GLUCOSE, BUN, CREATININE, CALCIUM, PROT, ALBUMIN, AST, ALT, ALKPHOS, BILITOT, GFRNONAA, GFRAA  No results found for: WBC, RBC, HGB, HCT, PLT, MCV, MCH, MCHC, RDW, LYMPHSABS, MONOABS, EOSABS,  BASOSABS  No results found for: POCLITH, LITHIUM   No results found for: PHENYTOIN, PHENOBARB, VALPROATE, CBMZ   .res Assessment: Plan:    Lambros was seen today for follow-up.  Diagnoses and all orders for this visit:  Social anxiety disorder  Generalized anxiety disorder  Major depressive disorder, recurrent episode, moderate (HCC)  Dysthymia  We have switched him from Lexapro to fluoxetine in hopes of  reducing side effects of sleepiness and some artifacts related to drinking alcohol with the Lexapro.  His depression has resolved with the fluoxetine and is not having any sleepiness.  He has residual social anxiety but is manageable with the propranolol and the current meds.  He is tolerating the fluoxetine well  Discussed the option of switching to sertraline as it is the only SSRI that is FDA approved for social anxiety.  He would rather not switch because he is somewhat fearful of getting tiredness like he had with the Lexapro.  Continue fluoxetine 40 mg for residual anxiety  Prn propranolol 20-40 mg social events it helps.  Discussed side effects of each medicine at length.  He would like to try the propranolol.  Not likely either affecting liver enzymes.  Will return to [place] in late January   Disc risk of liver disease and alcohol use and answered his questions. Last liver enzymes in Spring may repeat once Covid dies down.  Follow-up end of spring semester.   Lynder Parents, MD, DFAPA   Please see After Visit Summary for patient specific instructions.  No future appointments.  No orders of the defined types were placed in this encounter.     -------------------------------

## 2019-08-20 DIAGNOSIS — F331 Major depressive disorder, recurrent, moderate: Secondary | ICD-10-CM | POA: Diagnosis not present

## 2019-08-24 DIAGNOSIS — R748 Abnormal levels of other serum enzymes: Secondary | ICD-10-CM | POA: Diagnosis not present

## 2019-08-27 DIAGNOSIS — F331 Major depressive disorder, recurrent, moderate: Secondary | ICD-10-CM | POA: Diagnosis not present

## 2019-08-31 ENCOUNTER — Other Ambulatory Visit: Payer: Self-pay | Admitting: Psychiatry

## 2019-08-31 DIAGNOSIS — F411 Generalized anxiety disorder: Secondary | ICD-10-CM

## 2019-08-31 DIAGNOSIS — F4010 Social phobia, unspecified: Secondary | ICD-10-CM

## 2019-08-31 DIAGNOSIS — F331 Major depressive disorder, recurrent, moderate: Secondary | ICD-10-CM

## 2019-08-31 DIAGNOSIS — F341 Dysthymic disorder: Secondary | ICD-10-CM

## 2019-09-01 ENCOUNTER — Telehealth: Payer: Self-pay | Admitting: Psychiatry

## 2019-09-02 DIAGNOSIS — F331 Major depressive disorder, recurrent, moderate: Secondary | ICD-10-CM | POA: Diagnosis not present

## 2019-09-02 NOTE — Telephone Encounter (Signed)
error 

## 2019-09-10 DIAGNOSIS — F331 Major depressive disorder, recurrent, moderate: Secondary | ICD-10-CM | POA: Diagnosis not present

## 2019-09-17 DIAGNOSIS — F331 Major depressive disorder, recurrent, moderate: Secondary | ICD-10-CM | POA: Diagnosis not present

## 2019-09-24 DIAGNOSIS — F331 Major depressive disorder, recurrent, moderate: Secondary | ICD-10-CM | POA: Diagnosis not present

## 2019-10-02 DIAGNOSIS — F331 Major depressive disorder, recurrent, moderate: Secondary | ICD-10-CM | POA: Diagnosis not present

## 2019-10-08 DIAGNOSIS — F331 Major depressive disorder, recurrent, moderate: Secondary | ICD-10-CM | POA: Diagnosis not present

## 2019-10-15 DIAGNOSIS — F331 Major depressive disorder, recurrent, moderate: Secondary | ICD-10-CM | POA: Diagnosis not present

## 2019-10-29 DIAGNOSIS — F331 Major depressive disorder, recurrent, moderate: Secondary | ICD-10-CM | POA: Diagnosis not present

## 2019-11-04 DIAGNOSIS — F331 Major depressive disorder, recurrent, moderate: Secondary | ICD-10-CM | POA: Diagnosis not present

## 2019-11-12 DIAGNOSIS — F331 Major depressive disorder, recurrent, moderate: Secondary | ICD-10-CM | POA: Diagnosis not present

## 2019-11-20 DIAGNOSIS — F331 Major depressive disorder, recurrent, moderate: Secondary | ICD-10-CM | POA: Diagnosis not present

## 2019-11-23 ENCOUNTER — Other Ambulatory Visit: Payer: Self-pay | Admitting: Psychiatry

## 2019-11-23 DIAGNOSIS — F4010 Social phobia, unspecified: Secondary | ICD-10-CM

## 2019-11-23 DIAGNOSIS — F411 Generalized anxiety disorder: Secondary | ICD-10-CM

## 2019-11-23 DIAGNOSIS — F341 Dysthymic disorder: Secondary | ICD-10-CM

## 2019-11-23 DIAGNOSIS — F331 Major depressive disorder, recurrent, moderate: Secondary | ICD-10-CM

## 2019-12-03 DIAGNOSIS — F331 Major depressive disorder, recurrent, moderate: Secondary | ICD-10-CM | POA: Diagnosis not present

## 2019-12-10 DIAGNOSIS — F331 Major depressive disorder, recurrent, moderate: Secondary | ICD-10-CM | POA: Diagnosis not present

## 2019-12-17 DIAGNOSIS — F331 Major depressive disorder, recurrent, moderate: Secondary | ICD-10-CM | POA: Diagnosis not present

## 2019-12-24 DIAGNOSIS — F331 Major depressive disorder, recurrent, moderate: Secondary | ICD-10-CM | POA: Diagnosis not present

## 2019-12-31 DIAGNOSIS — F331 Major depressive disorder, recurrent, moderate: Secondary | ICD-10-CM | POA: Diagnosis not present

## 2020-01-07 DIAGNOSIS — F331 Major depressive disorder, recurrent, moderate: Secondary | ICD-10-CM | POA: Diagnosis not present

## 2020-01-13 ENCOUNTER — Telehealth (INDEPENDENT_AMBULATORY_CARE_PROVIDER_SITE_OTHER): Payer: BC Managed Care – PPO | Admitting: Psychiatry

## 2020-01-13 ENCOUNTER — Ambulatory Visit: Payer: BC Managed Care – PPO | Admitting: Psychiatry

## 2020-01-13 ENCOUNTER — Telehealth: Payer: Self-pay | Admitting: Psychiatry

## 2020-01-13 ENCOUNTER — Encounter: Payer: Self-pay | Admitting: Psychiatry

## 2020-01-13 DIAGNOSIS — F341 Dysthymic disorder: Secondary | ICD-10-CM | POA: Diagnosis not present

## 2020-01-13 DIAGNOSIS — F324 Major depressive disorder, single episode, in partial remission: Secondary | ICD-10-CM

## 2020-01-13 DIAGNOSIS — F4010 Social phobia, unspecified: Secondary | ICD-10-CM

## 2020-01-13 DIAGNOSIS — F411 Generalized anxiety disorder: Secondary | ICD-10-CM

## 2020-01-13 DIAGNOSIS — F5105 Insomnia due to other mental disorder: Secondary | ICD-10-CM

## 2020-01-13 NOTE — Telephone Encounter (Signed)
Mr. crews, mccollam are scheduled for a virtual visit with your provider today.    Just as we do with appointments in the office, we must obtain your consent to participate.  Your consent will be active for this visit and any virtual visit you may have with one of our providers in the next 365 days.    If you have a MyChart account, I can also send a copy of this consent to you electronically.  All virtual visits are billed to your insurance company just like a traditional visit in the office.  As this is a virtual visit, video technology does not allow for your provider to perform a traditional examination.  This may limit your provider's ability to fully assess your condition.  If your provider identifies any concerns that need to be evaluated in person or the need to arrange testing such as labs, EKG, etc, we will make arrangements to do so.    Although advances in technology are sophisticated, we cannot ensure that it will always work on either your end or our end.  If the connection with a video visit is poor, we may have to switch to a telephone visit.  With either a video or telephone visit, we are not always able to ensure that we have a secure connection.   I need to obtain your verbal consent now.   Are you willing to proceed with your visit today?   Criston Chancellor has provided verbal consent on 01/13/2020 for a virtual visit (video or telephone).   Lauraine Rinne, MD 01/13/2020  12:21 PM

## 2020-01-13 NOTE — Progress Notes (Signed)
Connor Hill [id number] [birth year] 24 y.o.   Virtual Visit via Telephone Note  I connected with pt by telephone and verified that I am speaking with the correct person using two identifiers.   I discussed the limitations, risks, security and privacy concerns of performing an evaluation and management service by telephone and the availability of in person appointments. I also discussed with the patient that there may be a patient responsible charge related to this service. The patient expressed understanding and agreed to proceed.  I discussed the assessment and treatment plan with the patient. The patient was provided an opportunity to ask questions and all were answered. The patient agreed with the plan and demonstrated an understanding of the instructions.   The patient was advised to call back or seek an in-person evaluation if the symptoms worsen or if the condition fails to improve as anticipated.  I provided 30 minutes of non-face-to-face time during this encounter. The call started at 1130 and ended at 1200. The patient was located at home  and the provider was located office.   Subjective:   Patient ID:  Connor Hill is a 24 y.o. (DOB [birth year]) male.  Chief Complaint:  Chief Complaint  Patient presents with  . Follow-up  . Anxiety    Anxiety Patient reports no dizziness.     [name] presents to the office today for follow-up of anxiety and depressive symptoms.  At visit in April.  No changes after recent switch to fluoxetine from Lexapro.    visit in June we increased fluoxetine for anxiety and residual depression from 20 to 30 mg daily.  This was helpful. Last seen August 2020.  The following changes were made: increase fluoxetine 40 mg for residual anxiety Prn propranolol 20-40 mg social events to see if it helps.  Discussed side effects of each medicine at length.  He would like to try the  propranolol.  Last seen August 18, 2019.  The following was noted: He returned to the [employer] for school in August. Pretty good overall.  Tried propranolol prn for social settings.  Alcohol with it seemed to increase effects of alcohol.  It did help with social anxiety.   Questions about the meds and medical concerns.  Liver enzymes a little elevated.   Hasn't gotten grades.  Only concern about 1 of the classes which was hard. Increase fluoxetine from 20 to 30 mg huge benefit but 30 to 40 mg less sig benefit  But without SE. Very beneficial. Still No depression.  Anxiety is better but not gone socially Not much other anxiety except some general.  Occ intrusive images of bad things happening. These are better with time also.  A little bit of sweating and a little heart racing.  He tolerated the increase in fluoxetine without side effects. He continued fluoxetine 40 mg daily and as needed propranolol 20 to 40 mg.  01/13/2020 appointment the following is noted: Not using propranolol that much to keep from relying on it. Prozac 40 mg doing really well and he really likes that dose.  Only SE if takes late it bothers his sleep.  May stay awake all night 2 times weekly.  Sleep is erratic and some daytime drowsiness and naps. Patient reports stable mood and denies depressed or irritable moods.  Patient Occ difficulty with sleep initiation or maintenance. Average Denies appetite disturbance.  Patient reports that energy and motivation have been good.  Patient denies any difficulty  with concentration.  Patient denies any suicidal ideation.  Summer plans still up in the air but will take summer school from undecided location.    Not drinking DT quarantine.  Past alcohol issues with some elevation liver enzymes but he's cut back.   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  2 semesters left to graduate.   Past Psychiatric Medication Trials: Lexapro sleepy, fluoxetine 40 , propranolol  Review of Systems:  Review of Systems   Neurological: Negative for dizziness, tremors and weakness.    Medications: I have reviewed the patient's current medications.  Current Outpatient Medications  Medication Sig Dispense Refill  . Creatine Monohydrate POWD Take by mouth.    . Dietary Management Product (NEOKE BCAA4) POWD Take by mouth.    Marland Kitchen FLUoxetine (PROZAC) 20 MG capsule TAKE 2 CAPSULES BY MOUTH EVERY DAY 180 capsule 0  . Linoleic Acid-Sunflower Oil (CLA) [phone] MG CAPS Take by mouth.    . propranolol (INDERAL) 20 MG tablet TAKE 1-2 TABLETS (20-40 MG TOTAL) BY MOUTH 2 (TWO) TIMES DAILY AS NEEDED. 100 tablet 0   No current facility-administered medications for this visit.    Medication Side Effects: None except less sexual interest  Allergies: No Known Allergies  History reviewed. No pertinent past medical history.  Family History  Adopted: Yes    Social History   Socioeconomic History  . Marital status: Unknown    Spouse name: Not on file  . Number of children: Not on file  . Years of education: Not on file  . Highest education level: Not on file  Occupational History  . Not on file  Tobacco Use  . Smoking status: Never Smoker  . Smokeless tobacco: Never Used  Substance and Sexual Activity  . Alcohol use: No  . Drug use: No  . Sexual activity: Not on file  Other Topics Concern  . Not on file  Social History Narrative  . Not on file   Social Determinants of Health   Financial Resource Strain:   . Difficulty of Paying Living Expenses:   Food Insecurity:   . Worried About Programme researcher, broadcasting/film/video in the Last Year:   . Barista in the Last Year:   Transportation Needs:   . Freight forwarder (Medical):   Marland Kitchen Lack of Transportation (Non-Medical):   Physical Activity:   . Days of Exercise per Week:   . Minutes of Exercise per Session:   Stress:   . Feeling of Stress :   Social Connections:   . Frequency of Communication with Friends and Family:   . Frequency of Social Gatherings with  Friends and Family:   . Attends Religious Services:   . Active Member of Clubs or Organizations:   . Attends Banker Meetings:   Marland Kitchen Marital Status:   Intimate Partner Violence:   . Fear of Current or Ex-Partner:   . Emotionally Abused:   Marland Kitchen Physically Abused:   . Sexually Abused:     Past Medical History, Surgical history, Social history, and Family history were reviewed and updated as appropriate.   Please see review of systems for further details on the patient's review from today.   Objective:   Physical Exam:  There were no vitals taken for this visit.  Physical Exam Constitutional:      General: He is not in acute distress.    Appearance: He is well-developed.  Musculoskeletal:        General: No deformity.  Neurological:  Mental Status: He is alert and oriented to person, place, and time.     Coordination: Coordination normal.  Psychiatric:        Attention and Perception: Attention normal. He is attentive.        Mood and Affect: Mood is anxious. Mood is not depressed. Affect is not labile, blunt, angry or inappropriate.        Speech: Speech normal.        Behavior: Behavior normal.        Thought Content: Thought content normal. Thought content does not include homicidal or suicidal ideation. Thought content does not include homicidal or suicidal plan.        Cognition and Memory: Cognition normal.        Judgment: Judgment normal.     Comments: Insight is good.     Lab Review:  No results found for: NA, K, CL, CO2, GLUCOSE, BUN, CREATININE, CALCIUM, PROT, ALBUMIN, AST, ALT, ALKPHOS, BILITOT, GFRNONAA, GFRAA  No results found for: WBC, RBC, HGB, HCT, PLT, MCV, MCH, MCHC, RDW, LYMPHSABS, MONOABS, EOSABS, BASOSABS  No results found for: POCLITH, LITHIUM   No results found for: PHENYTOIN, PHENOBARB, VALPROATE, CBMZ   .res Assessment: Plan:    Ricci was seen today for follow-up and anxiety.  Diagnoses and all orders for this visit:  Social  anxiety disorder  Generalized anxiety disorder  Dysthymia  Major depression single episode, in partial remission (HCC)  Insomnia due to mental condition  We have switched him from Lexapro to fluoxetine in hopes of reducing side effects of sleepiness and some artifacts related to drinking alcohol with the Lexapro.  His depression has resolved with the fluoxetine and is not having any sleepiness.  He has residual social anxiety but is manageable with the propranolol and the current meds.  He is tolerating the fluoxetine well  Disc treatment plan with eventual plan to reduce the fluoxetine at the right time.  Continue fluoxetine 40 mg but try splitting the dose to see if sleep is more consistent.. Answered questions about testosterone and metabolism on SSRI.  Answered questions about weight and SSRI.  He's not craving.  Not working out like he used to bc feels more OK with not working out. Disc use of discpline to overcome this. Also questions about it reducing his libido and drive for relationship and how that could be a problem if it persists.  Will eventually reduce the med as his anxiety is better managed for longer and this should help.  Prn propranolol 20-40 mg social events it helps.  Discussed side effects of each medicine at length.  He would like to try the propranolol.  Not likely either affecting liver enzymes.  Will continue [place] [facility] [place]    Disc risk of liver disease and alcohol use and answered his questions. Last liver enzymes in Spring may repeat once Covid dies down.  Follow-up 4 mos or so.     Meredith Staggers, MD, DFAPA   Please see After Visit Summary for patient specific instructions.  No future appointments.  No orders of the defined types were placed in this encounter.     -------------------------------

## 2020-02-02 ENCOUNTER — Ambulatory Visit: Payer: BC Managed Care – PPO | Admitting: Psychiatry

## 2020-02-12 DIAGNOSIS — F331 Major depressive disorder, recurrent, moderate: Secondary | ICD-10-CM | POA: Diagnosis not present

## 2020-02-18 DIAGNOSIS — F331 Major depressive disorder, recurrent, moderate: Secondary | ICD-10-CM | POA: Diagnosis not present

## 2020-02-25 DIAGNOSIS — F331 Major depressive disorder, recurrent, moderate: Secondary | ICD-10-CM | POA: Diagnosis not present

## 2020-03-03 DIAGNOSIS — F331 Major depressive disorder, recurrent, moderate: Secondary | ICD-10-CM | POA: Diagnosis not present

## 2020-03-10 DIAGNOSIS — F331 Major depressive disorder, recurrent, moderate: Secondary | ICD-10-CM | POA: Diagnosis not present

## 2020-03-13 ENCOUNTER — Other Ambulatory Visit: Payer: Self-pay | Admitting: Psychiatry

## 2020-03-13 DIAGNOSIS — F341 Dysthymic disorder: Secondary | ICD-10-CM

## 2020-03-13 DIAGNOSIS — F411 Generalized anxiety disorder: Secondary | ICD-10-CM

## 2020-03-13 DIAGNOSIS — F331 Major depressive disorder, recurrent, moderate: Secondary | ICD-10-CM

## 2020-03-13 DIAGNOSIS — F4010 Social phobia, unspecified: Secondary | ICD-10-CM

## 2020-03-17 DIAGNOSIS — F331 Major depressive disorder, recurrent, moderate: Secondary | ICD-10-CM | POA: Diagnosis not present

## 2020-03-21 ENCOUNTER — Other Ambulatory Visit: Payer: Self-pay | Admitting: Psychiatry

## 2020-03-21 DIAGNOSIS — F411 Generalized anxiety disorder: Secondary | ICD-10-CM

## 2020-03-21 DIAGNOSIS — F341 Dysthymic disorder: Secondary | ICD-10-CM

## 2020-03-21 DIAGNOSIS — F331 Major depressive disorder, recurrent, moderate: Secondary | ICD-10-CM

## 2020-03-21 DIAGNOSIS — F4010 Social phobia, unspecified: Secondary | ICD-10-CM

## 2020-03-23 ENCOUNTER — Other Ambulatory Visit: Payer: Self-pay

## 2020-03-23 ENCOUNTER — Telehealth: Payer: Self-pay | Admitting: Psychiatry

## 2020-03-23 DIAGNOSIS — F331 Major depressive disorder, recurrent, moderate: Secondary | ICD-10-CM

## 2020-03-23 DIAGNOSIS — F411 Generalized anxiety disorder: Secondary | ICD-10-CM

## 2020-03-23 DIAGNOSIS — F4010 Social phobia, unspecified: Secondary | ICD-10-CM

## 2020-03-23 DIAGNOSIS — F341 Dysthymic disorder: Secondary | ICD-10-CM

## 2020-03-23 MED ORDER — FLUOXETINE HCL 20 MG PO CAPS
40.0000 mg | ORAL_CAPSULE | Freq: Every day | ORAL | 0 refills | Status: DC
Start: 2020-03-23 — End: 2020-06-15

## 2020-03-23 NOTE — Telephone Encounter (Signed)
Rx sent 

## 2020-03-23 NOTE — Telephone Encounter (Signed)
Pt never filled Rx for Prozac sent to [facility] 03/14/20. Requesting Rx for Prozac to go to [facility] [address]. Out tomorrow. Apt 8/9

## 2020-03-24 DIAGNOSIS — F331 Major depressive disorder, recurrent, moderate: Secondary | ICD-10-CM | POA: Diagnosis not present

## 2020-03-31 DIAGNOSIS — F331 Major depressive disorder, recurrent, moderate: Secondary | ICD-10-CM | POA: Diagnosis not present

## 2020-04-06 ENCOUNTER — Other Ambulatory Visit: Payer: Self-pay | Admitting: Internal Medicine

## 2020-04-06 DIAGNOSIS — R109 Unspecified abdominal pain: Secondary | ICD-10-CM

## 2020-04-06 DIAGNOSIS — K5900 Constipation, unspecified: Secondary | ICD-10-CM | POA: Diagnosis not present

## 2020-04-06 DIAGNOSIS — Z1339 Encounter for screening examination for other mental health and behavioral disorders: Secondary | ICD-10-CM | POA: Diagnosis not present

## 2020-04-06 DIAGNOSIS — F1010 Alcohol abuse, uncomplicated: Secondary | ICD-10-CM | POA: Diagnosis not present

## 2020-04-07 DIAGNOSIS — F331 Major depressive disorder, recurrent, moderate: Secondary | ICD-10-CM | POA: Diagnosis not present

## 2020-04-14 DIAGNOSIS — F331 Major depressive disorder, recurrent, moderate: Secondary | ICD-10-CM | POA: Diagnosis not present

## 2020-04-18 ENCOUNTER — Encounter: Payer: Self-pay | Admitting: Psychiatry

## 2020-04-18 ENCOUNTER — Ambulatory Visit
Admission: RE | Admit: 2020-04-18 | Discharge: 2020-04-18 | Disposition: A | Discharge status: Home / Self Care | Payer: BC Managed Care – PPO | Source: Ambulatory Visit | Attending: Internal Medicine | Admitting: Internal Medicine

## 2020-04-18 ENCOUNTER — Ambulatory Visit (INDEPENDENT_AMBULATORY_CARE_PROVIDER_SITE_OTHER): Payer: BC Managed Care – PPO | Admitting: Psychiatry

## 2020-04-18 ENCOUNTER — Other Ambulatory Visit: Payer: Self-pay

## 2020-04-18 DIAGNOSIS — F4010 Social phobia, unspecified: Secondary | ICD-10-CM

## 2020-04-18 DIAGNOSIS — F341 Dysthymic disorder: Secondary | ICD-10-CM

## 2020-04-18 DIAGNOSIS — R1011 Right upper quadrant pain: Secondary | ICD-10-CM | POA: Diagnosis not present

## 2020-04-18 DIAGNOSIS — F324 Major depressive disorder, single episode, in partial remission: Secondary | ICD-10-CM | POA: Diagnosis not present

## 2020-04-18 DIAGNOSIS — F411 Generalized anxiety disorder: Secondary | ICD-10-CM | POA: Diagnosis not present

## 2020-04-18 DIAGNOSIS — R109 Unspecified abdominal pain: Secondary | ICD-10-CM

## 2020-04-18 DIAGNOSIS — F5105 Insomnia due to other mental disorder: Secondary | ICD-10-CM

## 2020-04-18 NOTE — Progress Notes (Signed)
Connor Hill [id number] 10-Jan-1996 24 y.o.    Subjective:   Patient ID:  Connor Hill is a 24 y.o. (DOB 05-Oct-1995) male.  Chief Complaint:  Chief Complaint  Patient presents with  . Follow-up  . Anxiety  . Alcohol Problem    Anxiety Patient reports no dizziness.     Rudy Luhmann presents to the office today for follow-up of anxiety and depressive symptoms.  At visit in April 2020.  No changes after recent switch to fluoxetine from Lexapro.    visit in June we increased fluoxetine for anxiety and residual depression from 20 to 30 mg daily.  This was helpful.  seen August 2020.  The following changes were made: increase fluoxetine 40 mg for residual anxiety Prn propranolol 20-40 mg social events to see if it helps.  Discussed side effects of each medicine at length.  He would like to try the propranolol.  \seen August 18, 2019.  The following was noted: He returned to the [place] [employer] [place] for school in August. Pretty good overall.  Tried propranolol prn for social settings.  Alcohol with it seemed to increase effects of alcohol.  It did help with social anxiety.   Questions about the meds and medical concerns.  Liver enzymes a little elevated.   Hasn't gotten grades.  Only concern about 1 of the classes which was hard. Increase fluoxetine from 20 to 30 mg huge benefit but 30 to 40 mg less sig benefit  But without SE. Very beneficial. Still No depression.  Anxiety is better but not gone socially Not much other anxiety except some general.  Occ intrusive images of bad things happening. These are better with time also.  A little bit of sweating and a little heart racing.  He tolerated the increase in fluoxetine without side effects. He continued fluoxetine 40 mg daily and as needed propranolol 20 to 40 mg.  01/13/2020 appointment the following is noted: Not using propranolol that much to keep from relying on it. Prozac 40  mg doing really well and he really likes that dose.  Only SE if takes late it bothers his sleep.  May stay awake all night 2 times weekly.  Sleep is erratic and some daytime drowsiness and naps. Patient reports stable mood and denies depressed or irritable moods.  Patient Occ difficulty with sleep initiation or maintenance. Average Denies appetite disturbance.  Patient reports that energy and motivation have been good.  Patient denies any difficulty with concentration.  Patient denies any suicidal ideation. Plan : no sig med changes  04/18/20 appt with the following noted: Going back to school.  On and off with sadness and moodiness.  Knew he needed to get sober.   Liver labs OK and [place]  Today, but continued abd pain.  Doc said said he needed exam.  Awaiting results.  Got to thinking drinking is affiliated with GI pain but pain continued even when not drinking.  Trying to find other ways to socialize at 24 is hard.  Can't go out with friends and not drink.  So locking himself in on weekends.  Since quit drinking still some stomach pain but not gone.  Realizes he can't moderate drinking.  All it takes is one drink to jump to too many.  Doesn't need it daily.  When I drink I feel like super man and better verbally.  Without it no urge and now words to talk but anxiety is managed.  Not motivated to  talk to people. Propranolol more prn without alcohol.  Summer plans still up in the air but will take summer school from undecided location.    Not drinking DT quarantine.  Past alcohol issues with some elevation liver enzymes but he's cut back.   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  2 semesters left to graduate.  M on SSRI.  Past Psychiatric Medication Trials: Lexapro sleepy, fluoxetine 40 , propranolol  Review of Systems:  Review of Systems  Gastrointestinal: Positive for abdominal pain.  Neurological: Negative for dizziness, tremors and weakness.    Medications: I have reviewed the patient's current medications.  Current  Outpatient Medications  Medication Sig Dispense Refill  . Creatine Monohydrate POWD Take by mouth.    . Dietary Management Product (NEOKE BCAA4) POWD Take by mouth.    Marland Kitchen FLUoxetine (PROZAC) 20 MG capsule Take 2 capsules (40 mg total) by mouth daily. 180 capsule 0  . Linoleic Acid-Sunflower Oil (CLA) [phone] MG CAPS Take by mouth.    . propranolol (INDERAL) 20 MG tablet TAKE 1-2 TABLETS (20-40 MG TOTAL) BY MOUTH 2 (TWO) TIMES DAILY AS NEEDED. 100 tablet 0   No current facility-administered medications for this visit.    Medication Side Effects: None except less sexual interest  Allergies: No Known Allergies  History reviewed. No pertinent past medical history.  Family History  Adopted: Yes    Social History   Socioeconomic History  . Marital status: Unknown    Spouse name: Not on file  . Number of children: Not on file  . Years of education: Not on file  . Highest education level: Not on file  Occupational History  . Not on file  Tobacco Use  . Smoking status: Never Smoker  . Smokeless tobacco: Never Used  Substance and Sexual Activity  . Alcohol use: No  . Drug use: No  . Sexual activity: Not on file  Other Topics Concern  . Not on file  Social History Narrative  . Not on file   Social Determinants of Health   Financial Resource Strain:   . Difficulty of Paying Living Expenses:   Food Insecurity:   . Worried About Programme researcher, broadcasting/film/video in the Last Year:   . Barista in the Last Year:   Transportation Needs:   . Freight forwarder (Medical):   Marland Kitchen Lack of Transportation (Non-Medical):   Physical Activity:   . Days of Exercise per Week:   . Minutes of Exercise per Session:   Stress:   . Feeling of Stress :   Social Connections:   . Frequency of Communication with Friends and Family:   . Frequency of Social Gatherings with Friends and Family:   . Attends Religious Services:   . Active Member of Clubs or Organizations:   . Attends Tax inspector Meetings:   Marland Kitchen Marital Status:   Intimate Partner Violence:   . Fear of Current or Ex-Partner:   . Emotionally Abused:   Marland Kitchen Physically Abused:   . Sexually Abused:     Past Medical History, Surgical history, Social history, and Family history were reviewed and updated as appropriate.   Please see review of systems for further details on the patient's review from today.   Objective:   Physical Exam:  There were no vitals taken for this visit.  Physical Exam Constitutional:      General: He is not in acute distress.    Appearance: He is well-developed.  Musculoskeletal:  General: No deformity.  Neurological:     Mental Status: He is alert and oriented to person, place, and time.     Coordination: Coordination normal.  Psychiatric:        Attention and Perception: Attention normal. He is attentive.        Mood and Affect: Mood is anxious. Mood is not depressed. Affect is not labile, blunt, angry or inappropriate.        Speech: Speech normal.        Behavior: Behavior normal.        Thought Content: Thought content normal. Thought content does not include homicidal or suicidal ideation. Thought content does not include homicidal or suicidal plan.        Cognition and Memory: Cognition normal.        Judgment: Judgment normal.     Comments: Insight is good.     Lab Review:  No results found for: NA, K, CL, CO2, GLUCOSE, BUN, CREATININE, CALCIUM, PROT, ALBUMIN, AST, ALT, ALKPHOS, BILITOT, GFRNONAA, GFRAA  No results found for: WBC, RBC, HGB, HCT, PLT, MCV, MCH, MCHC, RDW, LYMPHSABS, MONOABS, EOSABS, BASOSABS  No results found for: POCLITH, LITHIUM   No results found for: PHENYTOIN, PHENOBARB, VALPROATE, CBMZ   .res Assessment: Plan:    Churchill was seen today for follow-up, anxiety and alcohol problem.  Diagnoses and all orders for this visit:  Social anxiety disorder  Generalized anxiety disorder  Dysthymia  Major depression single episode, in  partial remission (HCC)  Insomnia due to mental condition  We have switched him from Lexapro to fluoxetine in hopes of reducing side effects of sleepiness and some artifacts related to drinking alcohol with the Lexapro.  His depression has resolved with the fluoxetine and is not having any sleepiness.  He has residual social anxiety but is manageable with the propranolol and the current meds.  He is tolerating the fluoxetine well  Disc treatment plan with eventual plan to reduce the fluoxetine at the right time.  More anxiety free lately,  fluoxetine 40 mg daily.  Consider reduce it to 30 mg bc might be a littlle too flat .  Social anxiety was there in the summer at times but not really there now.  ? Dulling him out now. Answered questions about testosterone and metabolism on SSRI.  Answered questions about weight and SSRI.  He's not craving.  Not working out like he used to bc feels more OK with not working out. Disc use of discpline to overcome this.  Option naltrexone disc .   Only creaves on weekend situationally.  Prn propranolol 20-40 mg social events it helps.  Discussed side effects of each medicine at length.  He would like to try the propranolol.  Disc his drinking and he cannot moderate and has suffered serious bad situations.  Disc sobriety tools.  Maintain sobriety.    Will continue [place] [employer] [place]    Disc risk of liver disease and alcohol use and answered his questions. Last liver enzymes in Spring may repeat once Covid dies down.  Follow-up 4 mos or so.     Meredith Staggers, MD, DFAPA   Please see After Visit Summary for patient specific instructions.  No future appointments.  No orders of the defined types were placed in this encounter.     -------------------------------

## 2020-04-21 DIAGNOSIS — F331 Major depressive disorder, recurrent, moderate: Secondary | ICD-10-CM | POA: Diagnosis not present

## 2020-04-29 DIAGNOSIS — F1220 Cannabis dependence, uncomplicated: Secondary | ICD-10-CM | POA: Diagnosis not present

## 2020-05-04 DIAGNOSIS — F1220 Cannabis dependence, uncomplicated: Secondary | ICD-10-CM | POA: Diagnosis not present

## 2020-05-06 DIAGNOSIS — F1220 Cannabis dependence, uncomplicated: Secondary | ICD-10-CM | POA: Diagnosis not present

## 2020-05-11 DIAGNOSIS — F1220 Cannabis dependence, uncomplicated: Secondary | ICD-10-CM | POA: Diagnosis not present

## 2020-05-13 DIAGNOSIS — F1220 Cannabis dependence, uncomplicated: Secondary | ICD-10-CM | POA: Diagnosis not present

## 2020-05-18 DIAGNOSIS — F1220 Cannabis dependence, uncomplicated: Secondary | ICD-10-CM | POA: Diagnosis not present

## 2020-05-20 DIAGNOSIS — F1220 Cannabis dependence, uncomplicated: Secondary | ICD-10-CM | POA: Diagnosis not present

## 2020-05-25 DIAGNOSIS — F1220 Cannabis dependence, uncomplicated: Secondary | ICD-10-CM | POA: Diagnosis not present

## 2020-05-27 DIAGNOSIS — J069 Acute upper respiratory infection, unspecified: Secondary | ICD-10-CM | POA: Diagnosis not present

## 2020-05-27 DIAGNOSIS — F1220 Cannabis dependence, uncomplicated: Secondary | ICD-10-CM | POA: Diagnosis not present

## 2020-06-03 DIAGNOSIS — F1220 Cannabis dependence, uncomplicated: Secondary | ICD-10-CM | POA: Diagnosis not present

## 2020-06-08 DIAGNOSIS — F1220 Cannabis dependence, uncomplicated: Secondary | ICD-10-CM | POA: Diagnosis not present

## 2020-06-10 DIAGNOSIS — F1220 Cannabis dependence, uncomplicated: Secondary | ICD-10-CM | POA: Diagnosis not present

## 2020-06-15 ENCOUNTER — Other Ambulatory Visit: Payer: Self-pay | Admitting: Psychiatry

## 2020-06-15 DIAGNOSIS — F4010 Social phobia, unspecified: Secondary | ICD-10-CM

## 2020-06-15 DIAGNOSIS — F341 Dysthymic disorder: Secondary | ICD-10-CM

## 2020-06-15 DIAGNOSIS — F411 Generalized anxiety disorder: Secondary | ICD-10-CM

## 2020-06-15 DIAGNOSIS — F331 Major depressive disorder, recurrent, moderate: Secondary | ICD-10-CM

## 2020-06-15 DIAGNOSIS — F1220 Cannabis dependence, uncomplicated: Secondary | ICD-10-CM | POA: Diagnosis not present

## 2020-06-17 DIAGNOSIS — F1220 Cannabis dependence, uncomplicated: Secondary | ICD-10-CM | POA: Diagnosis not present

## 2020-06-22 DIAGNOSIS — F1220 Cannabis dependence, uncomplicated: Secondary | ICD-10-CM | POA: Diagnosis not present

## 2020-06-29 DIAGNOSIS — F1220 Cannabis dependence, uncomplicated: Secondary | ICD-10-CM | POA: Diagnosis not present

## 2020-07-01 DIAGNOSIS — F1220 Cannabis dependence, uncomplicated: Secondary | ICD-10-CM | POA: Diagnosis not present

## 2020-07-06 DIAGNOSIS — F1220 Cannabis dependence, uncomplicated: Secondary | ICD-10-CM | POA: Diagnosis not present

## 2020-07-08 DIAGNOSIS — F1220 Cannabis dependence, uncomplicated: Secondary | ICD-10-CM | POA: Diagnosis not present

## 2020-07-13 DIAGNOSIS — F1220 Cannabis dependence, uncomplicated: Secondary | ICD-10-CM | POA: Diagnosis not present

## 2020-07-15 DIAGNOSIS — F1220 Cannabis dependence, uncomplicated: Secondary | ICD-10-CM | POA: Diagnosis not present

## 2020-07-20 DIAGNOSIS — F1220 Cannabis dependence, uncomplicated: Secondary | ICD-10-CM | POA: Diagnosis not present

## 2020-07-22 DIAGNOSIS — F1220 Cannabis dependence, uncomplicated: Secondary | ICD-10-CM | POA: Diagnosis not present

## 2020-07-27 DIAGNOSIS — F1220 Cannabis dependence, uncomplicated: Secondary | ICD-10-CM | POA: Diagnosis not present

## 2020-07-29 DIAGNOSIS — F1220 Cannabis dependence, uncomplicated: Secondary | ICD-10-CM | POA: Diagnosis not present

## 2020-08-31 DIAGNOSIS — D2239 Melanocytic nevi of other parts of face: Secondary | ICD-10-CM | POA: Diagnosis not present

## 2020-08-31 DIAGNOSIS — L700 Acne vulgaris: Secondary | ICD-10-CM | POA: Diagnosis not present

## 2020-08-31 DIAGNOSIS — B078 Other viral warts: Secondary | ICD-10-CM | POA: Diagnosis not present

## 2020-08-31 DIAGNOSIS — D485 Neoplasm of uncertain behavior of skin: Secondary | ICD-10-CM | POA: Diagnosis not present

## 2020-08-31 DIAGNOSIS — L723 Sebaceous cyst: Secondary | ICD-10-CM | POA: Diagnosis not present

## 2020-09-07 ENCOUNTER — Other Ambulatory Visit: Payer: Self-pay | Admitting: Psychiatry

## 2020-09-07 DIAGNOSIS — F4010 Social phobia, unspecified: Secondary | ICD-10-CM

## 2020-09-12 ENCOUNTER — Other Ambulatory Visit: Payer: Self-pay | Admitting: Psychiatry

## 2020-09-12 DIAGNOSIS — F411 Generalized anxiety disorder: Secondary | ICD-10-CM

## 2020-09-12 DIAGNOSIS — F341 Dysthymic disorder: Secondary | ICD-10-CM

## 2020-09-12 DIAGNOSIS — F4010 Social phobia, unspecified: Secondary | ICD-10-CM

## 2020-09-12 DIAGNOSIS — F331 Major depressive disorder, recurrent, moderate: Secondary | ICD-10-CM

## 2020-10-10 ENCOUNTER — Other Ambulatory Visit: Payer: Self-pay | Admitting: Psychiatry

## 2020-10-10 ENCOUNTER — Encounter: Payer: Self-pay | Admitting: Psychiatry

## 2020-10-10 ENCOUNTER — Other Ambulatory Visit: Payer: Self-pay

## 2020-10-10 ENCOUNTER — Ambulatory Visit (INDEPENDENT_AMBULATORY_CARE_PROVIDER_SITE_OTHER): Payer: BC Managed Care – PPO | Admitting: Psychiatry

## 2020-10-10 DIAGNOSIS — F4010 Social phobia, unspecified: Secondary | ICD-10-CM

## 2020-10-10 DIAGNOSIS — F5105 Insomnia due to other mental disorder: Secondary | ICD-10-CM | POA: Diagnosis not present

## 2020-10-10 DIAGNOSIS — F411 Generalized anxiety disorder: Secondary | ICD-10-CM

## 2020-10-10 DIAGNOSIS — F331 Major depressive disorder, recurrent, moderate: Secondary | ICD-10-CM

## 2020-10-10 DIAGNOSIS — K4090 Unilateral inguinal hernia, without obstruction or gangrene, not specified as recurrent: Secondary | ICD-10-CM | POA: Diagnosis not present

## 2020-10-10 DIAGNOSIS — F341 Dysthymic disorder: Secondary | ICD-10-CM

## 2020-10-10 MED ORDER — FLUOXETINE HCL 20 MG PO CAPS: 60.0000 mg | ORAL_CAPSULE | Freq: Every day | ORAL | 0 refills | Status: DC

## 2020-10-10 MED ORDER — PROPRANOLOL HCL 20 MG PO TABS: 20.0000 mg | ORAL_TABLET | Freq: Two times a day (BID) | ORAL | 0 refills | Status: DC | PRN

## 2020-10-10 MED ORDER — TRAZODONE HCL 50 MG PO TABS: 50.0000 mg | ORAL_TABLET | Freq: Every evening | ORAL | 1 refills | Status: DC | PRN

## 2020-10-10 NOTE — Progress Notes (Signed)
Connor Hill [id number] 06-28-1996 25 y.o.    Subjective:   Patient ID:  Connor Hill is a 25 y.o. (DOB 07-02-1996) male.  Chief Complaint:  Chief Complaint  Patient presents with  . Follow-up  . Anxiety  . Depression  . Sleeping Problem    Anxiety Patient reports no dizziness.     Connor Hill presents to the office today for follow-up of anxiety and depressive symptoms.  At visit in April 2020.  No changes after recent switch to fluoxetine from Lexapro.    visit in June we increased fluoxetine for anxiety and residual depression from 20 to 30 mg daily.  This was helpful.  seen August 2020.  The following changes were made: increase fluoxetine 40 mg for residual anxiety Prn propranolol 20-40 mg social events to see if it helps.  Discussed side effects of each medicine at length.  He would like to try the propranolol.  \seen August 18, 2019.  The following was noted: He returned to the [place] [employer] [place] for school in August. Pretty good overall.  Tried propranolol prn for social settings.  Alcohol with it seemed to increase effects of alcohol.  It did help with social anxiety.   Questions about the meds and medical concerns.  Liver enzymes a little elevated.   Hasn't gotten grades.  Only concern about 1 of the classes which was hard. Increase fluoxetine from 20 to 30 mg huge benefit but 30 to 40 mg less sig benefit  But without SE. Very beneficial. Still No depression.  Anxiety is better but not gone socially Not much other anxiety except some general.  Occ intrusive images of bad things happening. These are better with time also.  A little bit of sweating and a little heart racing.  He tolerated the increase in fluoxetine without side effects. He continued fluoxetine 40 mg daily and as needed propranolol 20 to 40 mg.  01/13/2020 appointment the following is noted: Not using propranolol that much to keep from relying on  it. Prozac 40 mg doing really well and he really likes that dose.  Only SE if takes late it bothers his sleep.  May stay awake all night 2 times weekly.  Sleep is erratic and some daytime drowsiness and naps. Patient reports stable mood and denies depressed or irritable moods.  Patient Occ difficulty with sleep initiation or maintenance. Average Denies appetite disturbance.  Patient reports that energy and motivation have been good.  Patient denies any difficulty with concentration.  Patient denies any suicidal ideation. Plan : no sig med changes  04/18/20 appt with the following noted: Going back to school.  On and off with sadness and moodiness.  Knew he needed to get sober.   Liver labs OK and [place]  Today, but continued abd pain.  Doc said said he needed exam.  Awaiting results.  Got to thinking drinking is affiliated with GI pain but pain continued even when not drinking.  Trying to find other ways to socialize at 24 is hard.  Can't go out with friends and not drink.  So locking himself in on weekends.  Since quit drinking still some stomach pain but not gone.  Realizes he can't moderate drinking.  All it takes is one drink to jump to too many.  Doesn't need it daily.  When I drink I feel like super man and better verbally.  Without it no urge and now words to talk but anxiety is managed.  Not motivated to talk to people. Propranolol more prn without alcohol. Summer plans still up in the air but will take summer school from undecided location.   Not drinking DT quarantine.  Past alcohol issues with some elevation liver enzymes but he's cut back.  M on SSRI. Plan: More anxiety free lately,  fluoxetine 40 mg daily.  Consider reduce it to 30 mg bc might be a littlle too flat .  Social anxiety was there in the summer at times but not really there now.  ? Dulling him out now.  10/10/2020 appt noted: Graduated.  3 job interviews and considering grad school.  Still living in [place] until decides future. Takes  fluoxetine 40 and propranolol 2-3 times per wek very helpful. Almost 5 mos sober now wiehtout alcohol.  Started AA bc couldn't control his drinking.   Doing well.  Anxiety is a lot better.   Is ready to make a adjustment upward in fluoxetine to further reduce social anxiety in large groups.  That flares anxiety. No SE noted. More trouble going to sleep off alcohol.  Asks about sleeper. 2-3 cups coffee daily.  No later than 2 pm.   Past Psychiatric Medication Trials: Lexapro sleepy, fluoxetine 40 , propranolol  Review of Systems:  Review of Systems  Gastrointestinal: Negative for abdominal pain.  Neurological: Negative for dizziness, tremors and weakness.    Medications: I have reviewed the patient's current medications.  Current Outpatient Medications  Medication Sig Dispense Refill  . Creatine Monohydrate POWD Take by mouth.    . Dietary Management Product (NEOKE BCAA4) POWD Take by mouth.    . Linoleic Acid-Sunflower Oil (CLA) [phone] MG CAPS Take by mouth.    . traZODone (DESYREL) 50 MG tablet Take 1-2 tablets (50-100 mg total) by mouth at bedtime as needed for sleep. 60 tablet 1  . FLUoxetine (PROZAC) 20 MG capsule Take 3 capsules (60 mg total) by mouth daily. 270 capsule 0  . propranolol (INDERAL) 20 MG tablet Take 1-2 tablets (20-40 mg total) by mouth 2 (two) times daily as needed. 100 tablet 0   No current facility-administered medications for this visit.    Medication Side Effects: None except less sexual interest  Allergies: No Known Allergies  History reviewed. No pertinent past medical history.  Family History  Adopted: Yes    Social History   Socioeconomic History  . Marital status: Unknown    Spouse name: Not on file  . Number of children: Not on file  . Years of education: Not on file  . Highest education level: Not on file  Occupational History  . Not on file  Tobacco Use  . Smoking status: Never Smoker  . Smokeless tobacco: Never Used  Substance  and Sexual Activity  . Alcohol use: No  . Drug use: No  . Sexual activity: Not on file  Other Topics Concern  . Not on file  Social History Narrative  . Not on file   Social Determinants of Health   Financial Resource Strain: Not on file  Food Insecurity: Not on file  Transportation Needs: Not on file  Physical Activity: Not on file  Stress: Not on file  Social Connections: Not on file  Intimate Partner Violence: Not on file    Past Medical History, Surgical history, Social history, and Family history were reviewed and updated as appropriate.   Please see review of systems for further details on the patient's review from today.   Objective:   Physical Exam:  There were no vitals taken for this visit.  Physical Exam Constitutional:      General: He is not in acute distress.    Appearance: He is well-developed.  Musculoskeletal:        General: No deformity.  Neurological:     Mental Status: He is alert and oriented to person, place, and time.     Coordination: Coordination normal.  Psychiatric:        Attention and Perception: Attention normal. He is attentive.        Mood and Affect: Mood is anxious. Mood is not depressed. Affect is not labile, blunt, angry or inappropriate.        Speech: Speech normal.        Behavior: Behavior normal.        Thought Content: Thought content normal. Thought content does not include homicidal or suicidal ideation. Thought content does not include homicidal or suicidal plan.        Cognition and Memory: Cognition normal.        Judgment: Judgment normal.     Comments: Insight is good. Anxiety situational     Lab Review:  No results found for: NA, K, CL, CO2, GLUCOSE, BUN, CREATININE, CALCIUM, PROT, ALBUMIN, AST, ALT, ALKPHOS, BILITOT, GFRNONAA, GFRAA  No results found for: WBC, RBC, HGB, HCT, PLT, MCV, MCH, MCHC, RDW, LYMPHSABS, MONOABS, EOSABS, BASOSABS  No results found for: POCLITH, LITHIUM   No results found for:  PHENYTOIN, PHENOBARB, VALPROATE, CBMZ   .res Assessment: Plan:    Andrey was seen today for follow-up, anxiety, depression and sleeping problem.  Diagnoses and all orders for this visit:  Social anxiety disorder -     FLUoxetine (PROZAC) 20 MG capsule; Take 3 capsules (60 mg total) by mouth daily. -     propranolol (INDERAL) 20 MG tablet; Take 1-2 tablets (20-40 mg total) by mouth 2 (two) times daily as needed.  Generalized anxiety disorder -     FLUoxetine (PROZAC) 20 MG capsule; Take 3 capsules (60 mg total) by mouth daily.  Dysthymia -     FLUoxetine (PROZAC) 20 MG capsule; Take 3 capsules (60 mg total) by mouth daily.  Insomnia due to mental condition -     traZODone (DESYREL) 50 MG tablet; Take 1-2 tablets (50-100 mg total) by mouth at bedtime as needed for sleep.  Major depressive disorder, recurrent episode, moderate (HCC) -     FLUoxetine (PROZAC) 20 MG capsule; Take 3 capsules (60 mg total) by mouth daily.  We have switched him from Lexapro to fluoxetine in hopes of reducing side effects of sleepiness and some SE related to drinking alcohol with the Lexapro.  His depression has resolved with the fluoxetine and is not having any sleepiness.  He has residual social anxiety but is manageable with the propranolol and the current meds.  He is tolerating the fluoxetine well.  He would like to go a little higher in the fluoxetine to try to better manage anxiety socially  Disc treatment plan with eventual plan to reduce the fluoxetine at the right time.  More anxiety free lately except social situation esp large groups.  Wants increase to  fluoxetine 60 mg daily to see if it can be even better.  Answered questions about testosterone and metabolism on SSRI.  Answered questions about weight and SSRI.  He's not craving.  Not working out like he used to bc feels more OK with not working out.  Prn propranolol 20-40 mg social events it  helps.  Discussed side effects of each medicine at  length.  He would like to try the propranolol.  Disc his drinking and he could not moderate and has suffered serious bad situations.  Disc sobriety tools. Sober 5 mos.  Maintain sobriety.    Trazodone 50-100 mg HS, and if fails.  He doesn't want to try Ambien  Follow-up 4 mos or so.     Meredith Staggers, MD, DFAPA   Please see After Visit Summary for patient specific instructions.  No future appointments.  No orders of the defined types were placed in this encounter.     -------------------------------

## 2020-10-11 ENCOUNTER — Other Ambulatory Visit: Payer: Self-pay

## 2020-10-11 DIAGNOSIS — F331 Major depressive disorder, recurrent, moderate: Secondary | ICD-10-CM

## 2020-10-11 DIAGNOSIS — F5105 Insomnia due to other mental disorder: Secondary | ICD-10-CM

## 2020-10-11 DIAGNOSIS — F411 Generalized anxiety disorder: Secondary | ICD-10-CM

## 2020-10-11 DIAGNOSIS — F4010 Social phobia, unspecified: Secondary | ICD-10-CM

## 2020-10-11 DIAGNOSIS — F341 Dysthymic disorder: Secondary | ICD-10-CM

## 2020-10-11 MED ORDER — TRAZODONE HCL 50 MG PO TABS: 50.0000 mg | ORAL_TABLET | Freq: Every evening | ORAL | 1 refills | Status: DC | PRN

## 2020-10-11 MED ORDER — FLUOXETINE HCL 20 MG PO CAPS: 60.0000 mg | ORAL_CAPSULE | Freq: Every day | ORAL | 0 refills | Status: DC

## 2020-10-11 MED ORDER — PROPRANOLOL HCL 20 MG PO TABS: 20.0000 mg | ORAL_TABLET | Freq: Two times a day (BID) | ORAL | 0 refills | Status: DC | PRN

## 2020-10-17 ENCOUNTER — Telehealth: Payer: Self-pay | Admitting: Psychiatry

## 2020-10-17 NOTE — Telephone Encounter (Signed)
Pt called to report Prozac dose increased from 40 mg to 60 mg about a week ago and since he is very groggy, blurred vision and insomnia. Pt talked to pharmacy and pharmacy mentioned,  may help to take at night in lieu of morning. Apt 4/27. Contact # [phone]

## 2020-10-17 NOTE — Telephone Encounter (Signed)
Yes it may help to switch the fluoxetine to nighttime.  The symptoms are likely to go away in a week or so anyway.  Another option he can use if needed is to take 40 mg on even days of the month and 60 mg on odd days of the month for 1 to 2 weeks and then go back to 60 mg every night.

## 2020-10-18 NOTE — Telephone Encounter (Signed)
He's a fairly new patient and we made recent med changes.  Please call him again and make sure he got the message and understood what to do.

## 2020-10-18 NOTE — Telephone Encounter (Signed)
Pt did not answer,LVM with instructions and to call back if he has any questions.

## 2020-10-19 NOTE — Telephone Encounter (Signed)
I finally spoke to him.He says he understands and will try taking the medication at night first.

## 2020-10-25 NOTE — Telephone Encounter (Signed)
reviewed

## 2020-10-30 ENCOUNTER — Other Ambulatory Visit: Payer: Self-pay | Admitting: Psychiatry

## 2020-10-30 DIAGNOSIS — F4010 Social phobia, unspecified: Secondary | ICD-10-CM

## 2020-10-31 NOTE — Telephone Encounter (Signed)
Refill

## 2020-11-11 DIAGNOSIS — K769 Liver disease, unspecified: Secondary | ICD-10-CM | POA: Diagnosis not present

## 2020-11-11 DIAGNOSIS — K4090 Unilateral inguinal hernia, without obstruction or gangrene, not specified as recurrent: Secondary | ICD-10-CM | POA: Diagnosis not present

## 2020-11-22 DIAGNOSIS — R1011 Right upper quadrant pain: Secondary | ICD-10-CM | POA: Diagnosis not present

## 2020-11-22 DIAGNOSIS — K769 Liver disease, unspecified: Secondary | ICD-10-CM | POA: Diagnosis not present

## 2020-11-30 IMAGING — US US ABDOMEN LIMITED
1 series · 14 of 25 positions shown · non-contrast
Comparison: None.

CLINICAL DATA: Right upper quadrant pain 8 months.

EXAM:
ULTRASOUND ABDOMEN LIMITED RIGHT UPPER QUADRANT

[Series 1: us abdomen limited · 0.26mm/px · 14 of 39 slices shown]
[im 1/39]
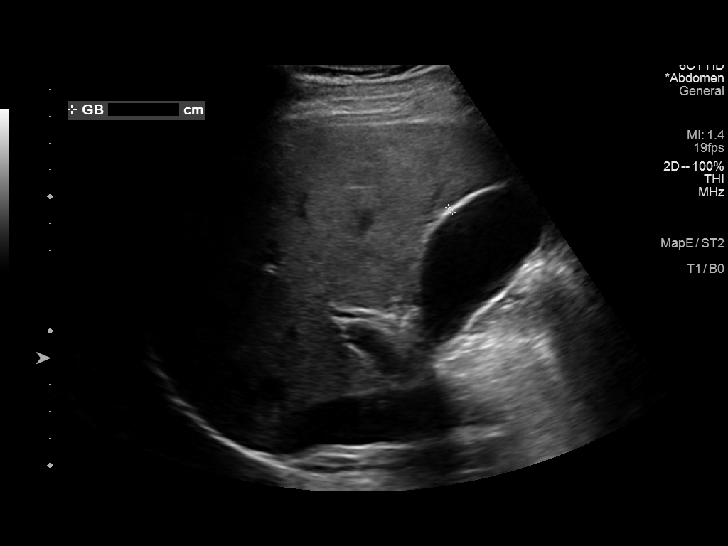
[im 4/39]
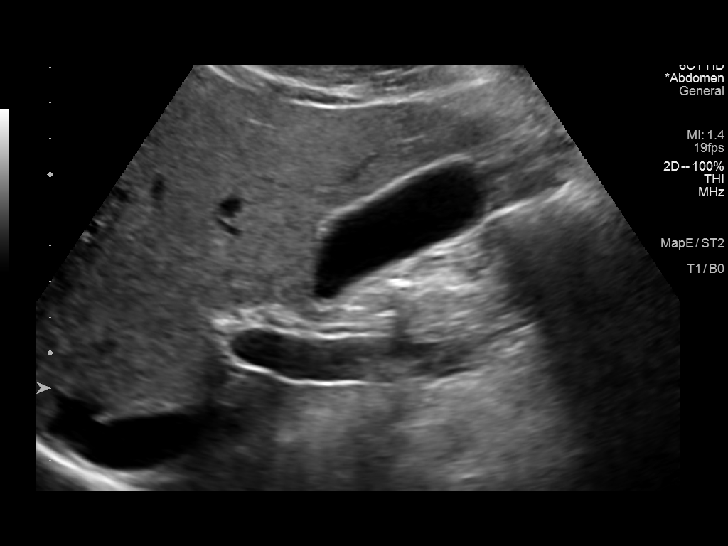
[im 7/39]
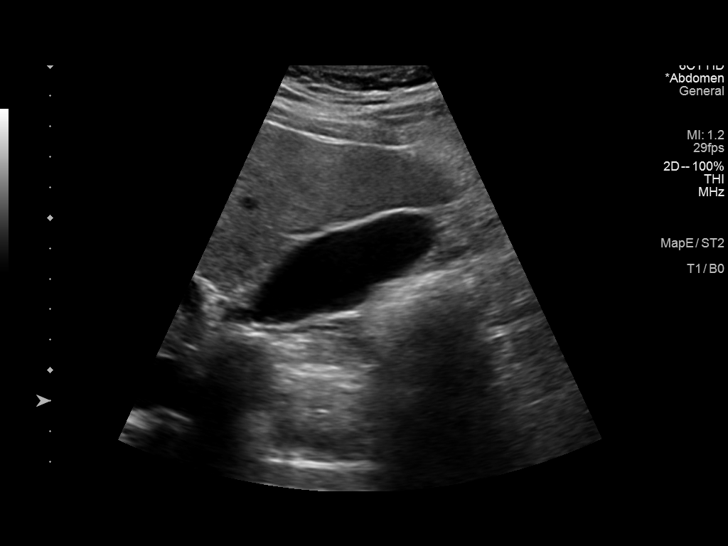
[im 10/39]
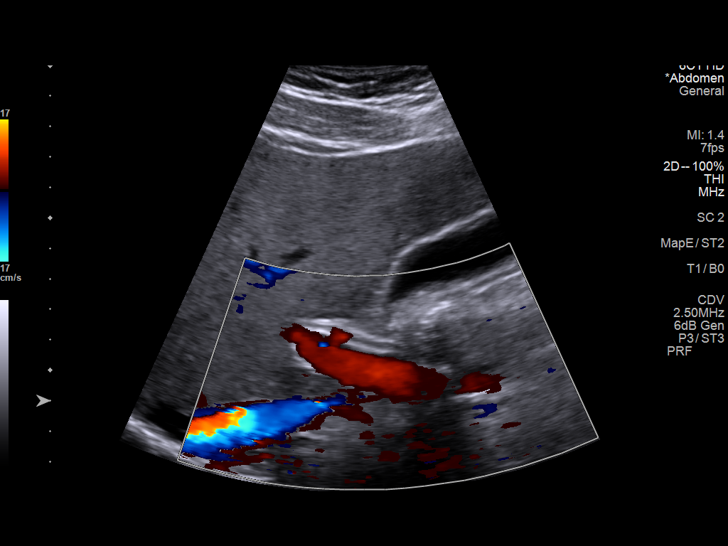
[im 13/39]
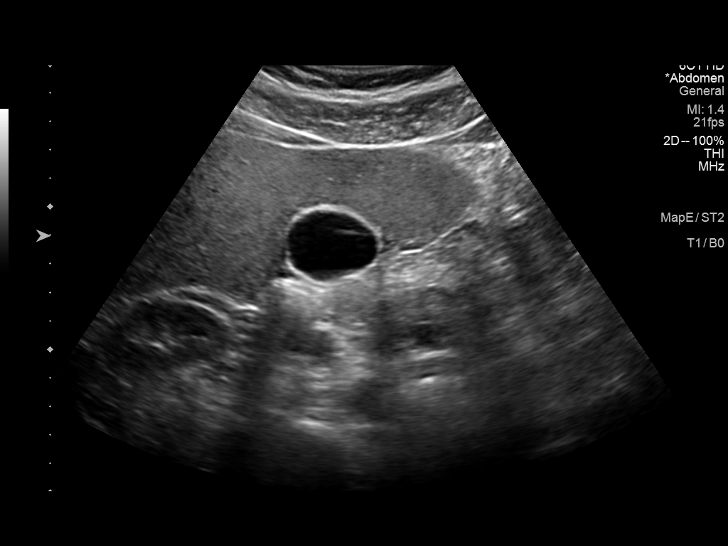
[im 15/39]
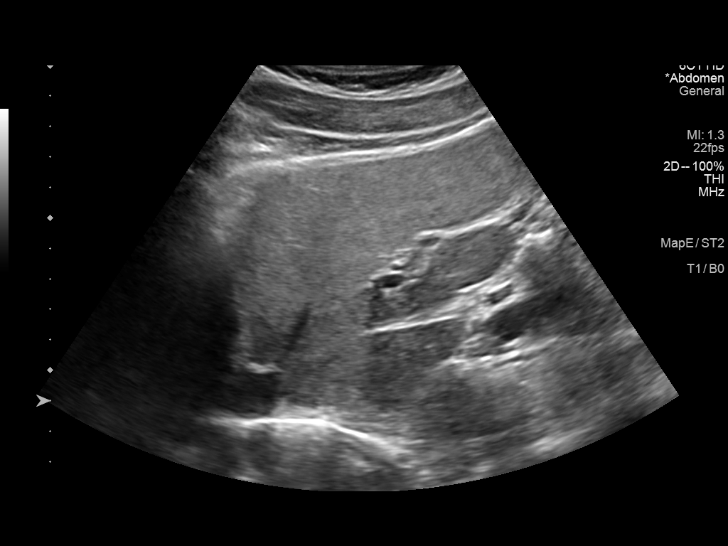
[im 18/39]
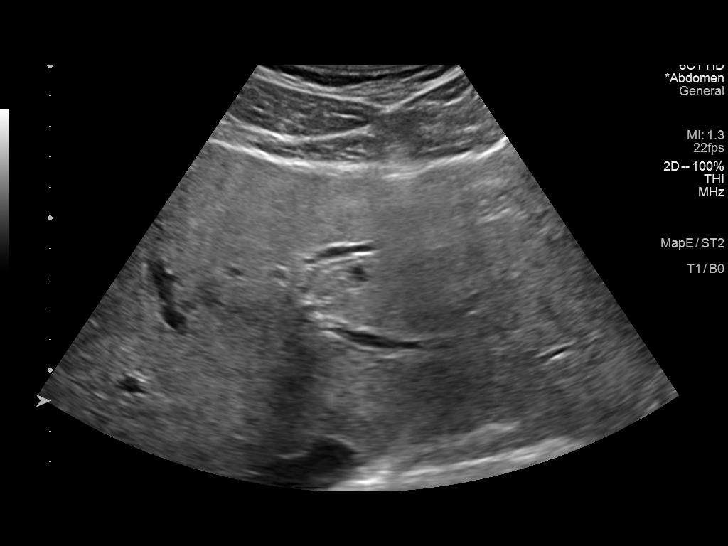
[im 21/39]
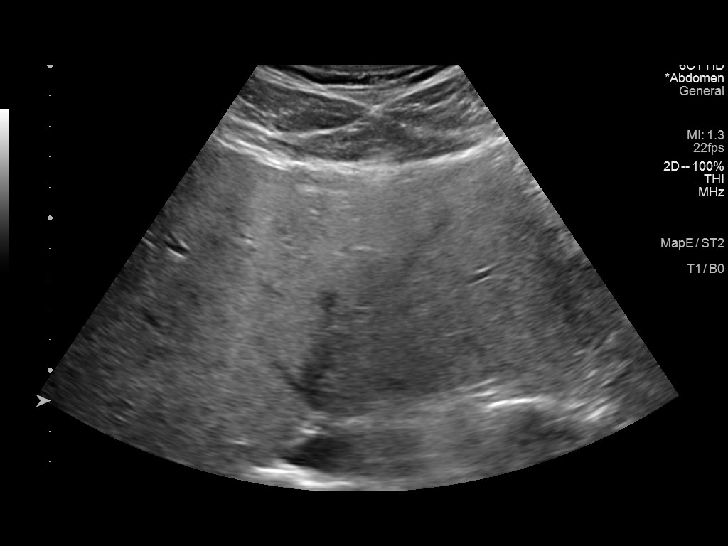
[im 24/39]
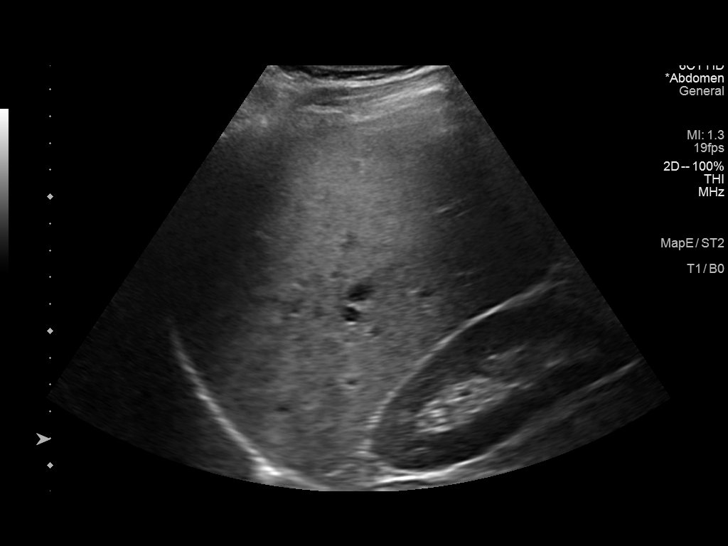
[im 26/39]
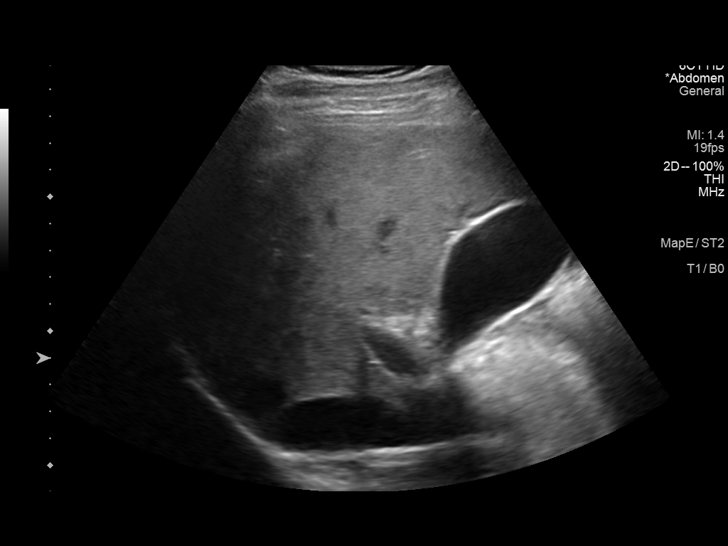
[im 29/39]
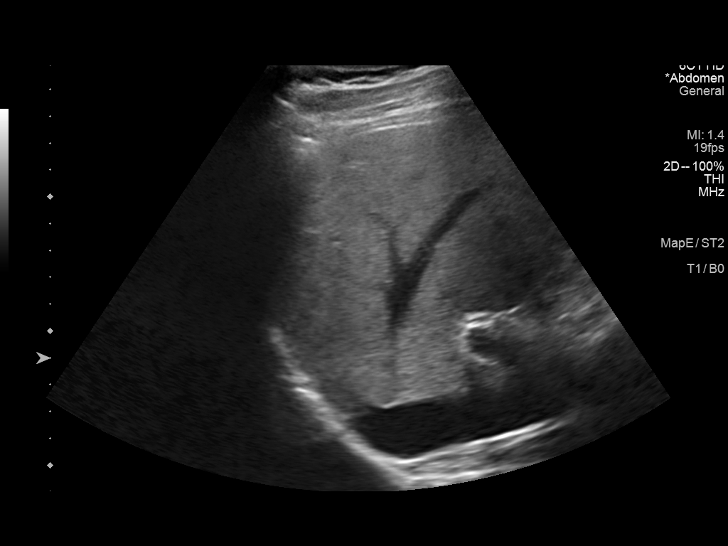
[im 32/39]
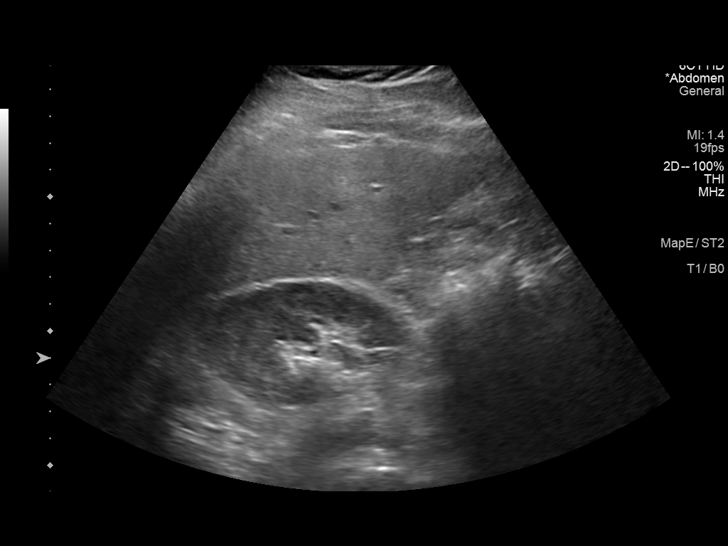
[im 35/39]
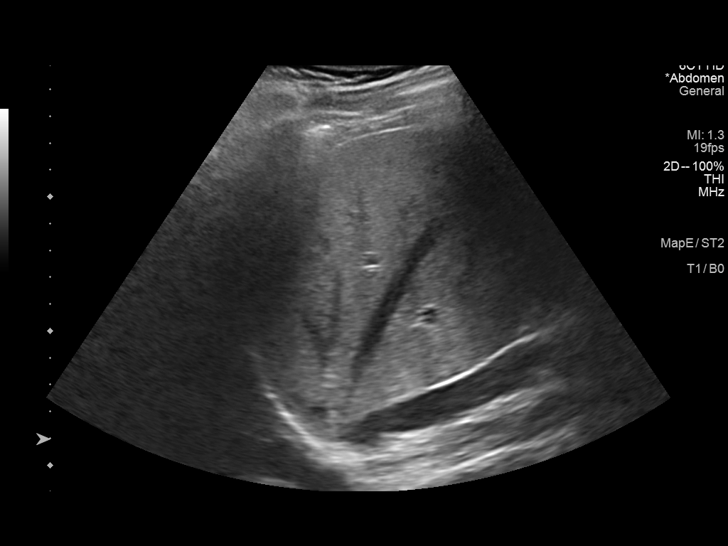
[im 39/39]
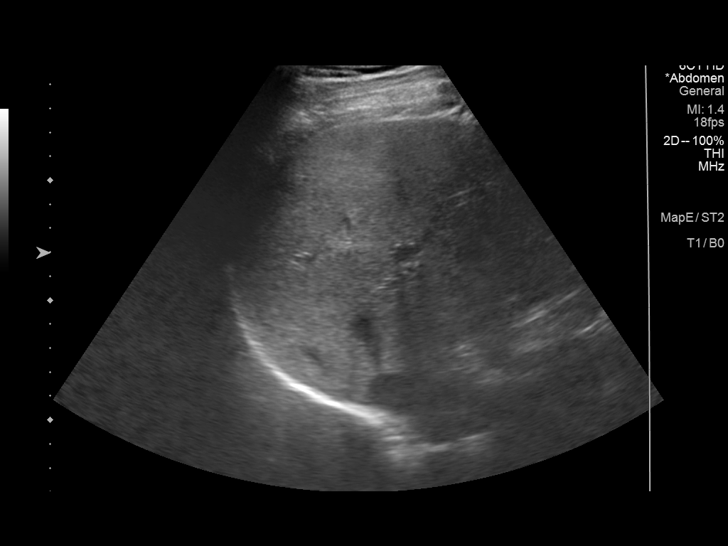

[14 of 25 positions shown; findings below may reference images not displayed]

FINDINGS: Gallbladder:

No gallstones or wall thickening visualized. No sonographic Murphy
sign noted by sonographer.

Common bile duct:

Diameter: 2.4 mm.

Liver:

No focal lesion identified. Within normal limits in parenchymal
echogenicity. Portal vein is patent on color Doppler imaging with
normal direction of blood flow towards the liver.

Other: None.
IMPRESSION: No acute findings.

## 2020-12-11 ENCOUNTER — Other Ambulatory Visit: Payer: Self-pay | Admitting: Psychiatry

## 2020-12-11 DIAGNOSIS — F411 Generalized anxiety disorder: Secondary | ICD-10-CM

## 2020-12-11 DIAGNOSIS — F341 Dysthymic disorder: Secondary | ICD-10-CM

## 2020-12-11 DIAGNOSIS — F4010 Social phobia, unspecified: Secondary | ICD-10-CM

## 2020-12-11 DIAGNOSIS — F331 Major depressive disorder, recurrent, moderate: Secondary | ICD-10-CM

## 2020-12-29 ENCOUNTER — Other Ambulatory Visit: Payer: Self-pay

## 2020-12-29 ENCOUNTER — Ambulatory Visit (INDEPENDENT_AMBULATORY_CARE_PROVIDER_SITE_OTHER): Payer: BC Managed Care – PPO | Admitting: Psychiatry

## 2020-12-29 ENCOUNTER — Encounter: Payer: Self-pay | Admitting: Psychiatry

## 2020-12-29 DIAGNOSIS — F5105 Insomnia due to other mental disorder: Secondary | ICD-10-CM

## 2020-12-29 DIAGNOSIS — F341 Dysthymic disorder: Secondary | ICD-10-CM | POA: Diagnosis not present

## 2020-12-29 DIAGNOSIS — F4010 Social phobia, unspecified: Secondary | ICD-10-CM | POA: Diagnosis not present

## 2020-12-29 DIAGNOSIS — F324 Major depressive disorder, single episode, in partial remission: Secondary | ICD-10-CM | POA: Diagnosis not present

## 2020-12-29 DIAGNOSIS — F411 Generalized anxiety disorder: Secondary | ICD-10-CM

## 2020-12-29 DIAGNOSIS — F331 Major depressive disorder, recurrent, moderate: Secondary | ICD-10-CM

## 2020-12-29 MED ORDER — FLUOXETINE HCL 20 MG PO CAPS: 60.0000 mg | ORAL_CAPSULE | Freq: Every day | ORAL | 1 refills | Status: DC

## 2020-12-29 MED ORDER — PROPRANOLOL HCL 20 MG PO TABS: 20.0000 mg | ORAL_TABLET | Freq: Two times a day (BID) | ORAL | 3 refills | Status: DC | PRN

## 2020-12-29 MED ORDER — PROPRANOLOL HCL 20 MG PO TABS: 20.0000 mg | ORAL_TABLET | Freq: Two times a day (BID) | ORAL | 3 refills | Status: AC | PRN

## 2020-12-29 NOTE — Progress Notes (Signed)
Connor Hill Rockman [id number] 09-23-1995 25 y.o.    Subjective:   Patient ID:  Connor Hill Axley is a 25 y.o. (DOB 09-23-1995) male.  Chief Complaint:  Chief Complaint  Patient presents with  . Follow-up  . Social anxiety disorder    Anxiety Patient reports no chest pain, dizziness or palpitations.     Connor Hill Stainback presents to the office today for follow-up of anxiety and depressive symptoms.  At visit in April 2020.  No changes after recent switch to fluoxetine from Lexapro.    visit in June we increased fluoxetine for anxiety and residual depression from 20 to 30 mg daily.  This was helpful.  seen August 2020.  The following changes were made: increase fluoxetine 40 mg for residual anxiety Prn propranolol 20-40 mg social events to see if it helps.  Discussed side effects of each medicine at length.  He would like to try the propranolol.  \seen August 18, 2019.  The following was noted: [place] in [place]. Pretty good overall.  Tried propranolol prn for social settings.  Alcohol with it seemed to increase effects of alcohol.  It did help with social anxiety.   Questions about the meds and medical concerns.  Liver enzymes a little elevated.   Hasn't gotten grades.  Only concern about 1 of the classes which was hard. Increase fluoxetine from 20 to 30 mg huge benefit but 30 to 40 mg less sig benefit  But without SE. Very beneficial. Still No depression.  Anxiety is better but not gone socially Not much other anxiety except some general.  Occ intrusive images of bad things happening. These are better with time also.  A little bit of sweating and a little heart racing.  He tolerated the increase in fluoxetine without side effects. He continued fluoxetine 40 mg daily and as needed propranolol 20 to 40 mg.  01/13/2020 appointment the following is noted: Not using propranolol that much to keep from  relying on it. Prozac 40 mg doing really well and he really likes that dose.  Only SE if takes late it bothers his sleep.  May stay awake all night 2 times weekly.  Sleep is erratic and some daytime drowsiness and naps. Patient reports stable mood and denies depressed or irritable moods.  Patient Occ difficulty with sleep initiation or maintenance. Average Denies appetite disturbance.  Patient reports that energy and motivation have been good.  Patient denies any difficulty with concentration.  Patient denies any suicidal ideation. Plan : no sig med changes  04/18/20 appt with the following noted: Going back to school.  On and off with sadness and moodiness.  Knew he needed to get sober.   Liver labs OK and [place]  Today, but continued abd pain.  Doc said said he needed exam.  Awaiting results.  Got to thinking drinking is affiliated with GI pain but pain continued even when not drinking.  Trying to find other ways to socialize at 24 is hard.  Can't go out with friends and not drink.  So locking himself in on weekends.  Since quit drinking still some stomach pain but not gone.  Realizes he can't moderate drinking.  All it takes is one drink to jump to too many.  Doesn't need it daily.  When I drink I feel like super man and better verbally.  Without it no urge and now words to talk but anxiety is managed.  Not  motivated to talk to people. Propranolol more prn without alcohol. Summer plans still up in the air but will take summer school from undecided location.   Not drinking DT quarantine.  Past alcohol issues with some elevation liver enzymes but he's cut back.  M on SSRI. Plan: More anxiety free lately,  fluoxetine 40 mg daily.  Consider reduce it to 30 mg bc might be a littlle too flat .  Social anxiety was there in the summer at times but not really there now.  ? Dulling him out now.  10/10/2020 appt noted: Graduated.  3 job interviews and considering grad school.  Still living in [place] until decides  future. Takes fluoxetine 40 and propranolol 2-3 times per wek very helpful. Almost 5 mos sober now wiehtout alcohol.  Started AA bc couldn't control his drinking.   Doing well.  Anxiety is a lot better.   Is ready to make a adjustment upward in fluoxetine to further reduce social anxiety in large groups.  That flares anxiety. No SE noted. More trouble going to sleep off alcohol.  Asks about sleeper. 2-3 cups coffee daily.  No later than 2 pm. Plan: More anxiety free lately except social situation esp large groups.  Wants increase to  fluoxetine 60 mg daily to see if it can be even better.   12/29/2020 appointment with the following noted: Been amazing with increase Prozac to 60 mg daily.  Propranolol is cherry on top with social and performance anxiety.  "night and day" difference. Initial SE sleep problems but taking at 1 PM and that seems to solve the problems. Good sleep and energy. Anxiety is manageable now. Depression manageable. Maintained sobriety since October last year and that has helped.  Still doing AA and it's helped.  Going to church a lot in [place] and it has helped get life back on track. 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 Wouldn't have been possible if still drinking. Never needed trazodone.  Past Psychiatric Medication Trials: Lexapro sleepy, fluoxetine 60 , propranolol  Review of Systems:  Review of Systems  Cardiovascular: Negative for chest pain and palpitations.  Gastrointestinal: Negative for abdominal pain.  Neurological: Negative for dizziness, tremors and weakness.    Medications: I have reviewed the patient's current medications.  Current Outpatient Medications  Medication Sig Dispense Refill  . Creatine Monohydrate POWD Take by mouth.    . Dietary Management Product (NEOKE BCAA4) POWD Take by mouth.    . Linoleic Acid-Sunflower Oil (CLA) [phone] MG CAPS Take by mouth.    Marland Kitchen FLUoxetine (PROZAC) 20 MG capsule  Take 3 capsules (60 mg total) by mouth daily. 270 capsule 1  . propranolol (INDERAL) 20 MG tablet Take 1-2 tablets (20-40 mg total) by mouth 2 (two) times daily as needed. 100 tablet 3   No current facility-administered medications for this visit.    Medication Side Effects: None except less sexual interest  Allergies: No Known Allergies  History reviewed. No pertinent past medical history.  Family History  Adopted: Yes    Social History   Socioeconomic History  . Marital status: Unknown    Spouse name: Not on file  . Number of children: Not on file  . Years of education: Not on file  . Highest education level: Not on file  Occupational History  . Not on file  Tobacco Use  . Smoking status: Never Smoker  . Smokeless tobacco: Never Used  Substance and Sexual Activity  .  Alcohol use: No  . Drug use: No  . Sexual activity: Not on file  Other Topics Concern  . Not on file  Social History Narrative  . Not on file   Social Determinants of Health   Financial Resource Strain: Not on file  Food Insecurity: Not on file  Transportation Needs: Not on file  Physical Activity: Not on file  Stress: Not on file  Social Connections: Not on file  Intimate Partner Violence: Not on file    Past Medical History, Surgical history, Social history, and Family history were reviewed and updated as appropriate.   Please see review of systems for further details on the patient's review from today.   Objective:   Physical Exam:  There were no vitals taken for this visit.  Physical Exam Constitutional:      General: He is not in acute distress.    Appearance: He is well-developed.  Musculoskeletal:        General: No deformity.  Neurological:     Mental Status: He is alert and oriented to person, place, and time.     Coordination: Coordination normal.  Psychiatric:        Attention and Perception: Attention normal. He is attentive.        Mood and Affect: Mood is not anxious or  depressed. Affect is not labile, blunt, angry or inappropriate.        Speech: Speech normal.        Behavior: Behavior normal.        Thought Content: Thought content normal. Thought content does not include homicidal or suicidal ideation. Thought content does not include homicidal or suicidal plan.        Cognition and Memory: Cognition normal.        Judgment: Judgment normal.     Comments: Insight is good. Anxiety situational is much better     Lab Review:  No results found for: NA, K, CL, CO2, GLUCOSE, BUN, CREATININE, CALCIUM, PROT, ALBUMIN, AST, ALT, ALKPHOS, BILITOT, GFRNONAA, GFRAA  No results found for: WBC, RBC, HGB, HCT, PLT, MCV, MCH, MCHC, RDW, LYMPHSABS, MONOABS, EOSABS, BASOSABS  No results found for: POCLITH, LITHIUM   No results found for: PHENYTOIN, PHENOBARB, VALPROATE, CBMZ   .res Assessment: Plan:    Seger was seen today for follow-up and social anxiety disorder.  Diagnoses and all orders for this visit:  Social anxiety disorder -     Discontinue: FLUoxetine (PROZAC) 20 MG capsule; Take 3 capsules (60 mg total) by mouth daily. -     Discontinue: propranolol (INDERAL) 20 MG tablet; Take 1-2 tablets (20-40 mg total) by mouth 2 (two) times daily as needed. -     Discontinue: FLUoxetine (PROZAC) 20 MG capsule; Take 3 capsules (60 mg total) by mouth daily. -     Discontinue: propranolol (INDERAL) 20 MG tablet; Take 1-2 tablets (20-40 mg total) by mouth 2 (two) times daily as needed. -     FLUoxetine (PROZAC) 20 MG capsule; Take 3 capsules (60 mg total) by mouth daily. -     propranolol (INDERAL) 20 MG tablet; Take 1-2 tablets (20-40 mg total) by mouth 2 (two) times daily as needed.  Generalized anxiety disorder -     Discontinue: FLUoxetine (PROZAC) 20 MG capsule; Take 3 capsules (60 mg total) by mouth daily. -     Discontinue: FLUoxetine (PROZAC) 20 MG capsule; Take 3 capsules (60 mg total) by mouth daily. -     FLUoxetine (PROZAC) 20 MG capsule;  Take 3  capsules (60 mg total) by mouth daily.  Major depression single episode, in partial remission (HCC)  Dysthymia -     Discontinue: FLUoxetine (PROZAC) 20 MG capsule; Take 3 capsules (60 mg total) by mouth daily. -     Discontinue: FLUoxetine (PROZAC) 20 MG capsule; Take 3 capsules (60 mg total) by mouth daily. -     FLUoxetine (PROZAC) 20 MG capsule; Take 3 capsules (60 mg total) by mouth daily.  Insomnia due to mental condition  Major depressive disorder, recurrent episode, moderate (HCC) -     Discontinue: FLUoxetine (PROZAC) 20 MG capsule; Take 3 capsules (60 mg total) by mouth daily. -     Discontinue: FLUoxetine (PROZAC) 20 MG capsule; Take 3 capsules (60 mg total) by mouth daily. -     FLUoxetine (PROZAC) 20 MG capsule; Take 3 capsules (60 mg total) by mouth daily.  We have switched him from Lexapro to fluoxetine in hopes of reducing side effects of sleepiness and some SE related to drinking alcohol with the Lexapro.  His depression has resolved with the fluoxetine and is not having any sleepiness.  He has residual social anxiety but is manageable with the propranolol and the current meds.  He is tolerating the fluoxetine well.  He would like to go a little higher in the fluoxetine to try to better manage anxiety socially  Disc treatment plan with eventual plan to reduce the fluoxetine at the right time.  More anxiety free lately except social situation esp large groups.  Wants increase to  fluoxetine 60 mg daily to see if it can be even better.  Answered questions about testosterone and metabolism on SSRI.  Answered questions about weight and SSRI.  He's not craving.  Not working out like he used to bc feels more OK with not working out.  Prn propranolol 20-40 mg social events it helps.  Discussed side effects of each medicine at length.  Good benefit with it.  Disc dosing range.  Trazodone no longer needed.  Disc his drinking and he could not moderate and has suffered serious bad  situations.  Disc sobriety tools. Sober 5 mos.  Maintain sobriety.    Trazodone 50-100 mg HS, and if fails.  He doesn't want to try Ambien  Follow-up 6 mos or so.     Meredith Staggers, MD, DFAPA   Please see After Visit Summary for patient specific instructions.  No future appointments.  No orders of the defined types were placed in this encounter.     -------------------------------

## 2021-01-04 ENCOUNTER — Ambulatory Visit: Payer: BC Managed Care – PPO | Admitting: Psychiatry

## 2021-01-18 DIAGNOSIS — R1011 Right upper quadrant pain: Secondary | ICD-10-CM | POA: Diagnosis not present

## 2021-06-22 ENCOUNTER — Ambulatory Visit: Payer: BC Managed Care – PPO | Admitting: Psychiatry

## 2021-08-19 ENCOUNTER — Other Ambulatory Visit: Payer: Self-pay | Admitting: Psychiatry

## 2021-08-19 DIAGNOSIS — F341 Dysthymic disorder: Secondary | ICD-10-CM

## 2021-08-19 DIAGNOSIS — F411 Generalized anxiety disorder: Secondary | ICD-10-CM

## 2021-08-19 DIAGNOSIS — F4010 Social phobia, unspecified: Secondary | ICD-10-CM

## 2021-08-19 DIAGNOSIS — F331 Major depressive disorder, recurrent, moderate: Secondary | ICD-10-CM

## 2021-08-21 NOTE — Telephone Encounter (Signed)
Called patient to see what dose of fluoxetine he is taking. It was increased to 60 mg, but then was supposed to be weaned at some point.

## 2021-08-22 NOTE — Telephone Encounter (Signed)
Called parents (on [place]) and they contacted patient to verify dosage. Verified pharmacy.

## 2021-08-22 NOTE — Telephone Encounter (Signed)
Tried to contact patient again. LVM to RC.

## 2021-08-29 ENCOUNTER — Encounter: Payer: Self-pay | Admitting: Psychiatry

## 2021-08-29 ENCOUNTER — Ambulatory Visit: Payer: BC Managed Care – PPO | Admitting: Psychiatry

## 2021-08-29 ENCOUNTER — Other Ambulatory Visit: Payer: Self-pay

## 2021-08-29 DIAGNOSIS — F411 Generalized anxiety disorder: Secondary | ICD-10-CM | POA: Diagnosis not present

## 2021-08-29 DIAGNOSIS — F324 Major depressive disorder, single episode, in partial remission: Secondary | ICD-10-CM | POA: Diagnosis not present

## 2021-08-29 DIAGNOSIS — F341 Dysthymic disorder: Secondary | ICD-10-CM

## 2021-08-29 DIAGNOSIS — F5105 Insomnia due to other mental disorder: Secondary | ICD-10-CM

## 2021-08-29 DIAGNOSIS — F4010 Social phobia, unspecified: Secondary | ICD-10-CM | POA: Diagnosis not present

## 2021-08-29 DIAGNOSIS — F331 Major depressive disorder, recurrent, moderate: Secondary | ICD-10-CM

## 2021-08-29 MED ORDER — FLUOXETINE HCL 20 MG PO CAPS: 60.0000 mg | ORAL_CAPSULE | Freq: Every day | ORAL | 1 refills | Status: DC

## 2021-08-29 NOTE — Progress Notes (Signed)
Connor Hill [id number] [birth year] 25 y.o.    Subjective:   Patient ID:  Connor Hill is a 25 y.o. (DOB 10-06-1995) male.  Chief Complaint:  Chief Complaint  Patient presents with   Follow-up   Anxiety   Depression    Anxiety Patient reports no dizziness or palpitations.    Connor Hill presents to the office today for follow-up of anxiety and depressive symptoms.  At visit in April 2020.  No changes after recent switch to fluoxetine from Lexapro.    visit in June we increased fluoxetine for anxiety and residual depression from 20 to 30 mg daily.  This was helpful.  seen August 2020.  The following changes were made: increase fluoxetine 40 mg for residual anxiety Prn propranolol 20-40 mg social events to see if it helps.  Discussed side effects of each medicine at length.  He would like to try the propranolol.  \seen August 18, 2019.  The following was noted: He returned to the [name] for school in August. Pretty good overall.  Tried propranolol prn for social settings.  Alcohol with it seemed to increase effects of alcohol.  It did help with social anxiety.   Questions about the meds and medical concerns.  Liver enzymes a little elevated.   Hasn't gotten grades.  Only concern about 1 of the classes which was hard. Increase fluoxetine from 20 to 30 mg huge benefit but 30 to 40 mg less sig benefit  But without SE. Very beneficial. Still No depression.  Anxiety is better but not gone socially Not much other anxiety except some general.  Occ intrusive images of bad things happening. These are better with time also.  A little bit of sweating and a little heart racing.  He tolerated the increase in fluoxetine without side effects. He continued fluoxetine 40 mg daily and as needed propranolol 20 to 40 mg.  01/13/2020 appointment the following is noted: Not using propranolol that much to keep from relying on  it. Prozac 40 mg doing really well and he really likes that dose.  Only SE if takes late it bothers his sleep.  May stay awake all night 2 times weekly.  Sleep is erratic and some daytime drowsiness and naps. Patient reports stable mood and denies depressed or irritable moods.  Patient Occ difficulty with sleep initiation or maintenance. Average Denies appetite disturbance.  Patient reports that energy and motivation have been good.  Patient denies any difficulty with concentration.  Patient denies any suicidal ideation. Plan : no sig med changes  04/18/20 appt with the following noted: Going back to school.  On and off with sadness and moodiness.  Knew he needed to get sober.   Liver labs OK and [place]  Today, but continued abd pain.  Doc said said he needed exam.  Awaiting results.  Got to thinking drinking is affiliated with GI pain but pain continued even when not drinking.  Trying to find other ways to socialize at 25 is hard.  Can't go out with friends and not drink.  So locking himself in on weekends.  Since quit drinking still some stomach pain but not gone.  Realizes he can't moderate drinking.  All it takes is one drink to jump to too many.  Doesn't need it daily.  When I drink I feel like super man and better verbally.  Without it no urge and now words to talk but anxiety is managed.  Not motivated to  talk to people. Propranolol more prn without alcohol. Summer plans still up in the air but will take summer school from undecided location.   Not drinking DT quarantine.  Past alcohol issues with some elevation liver enzymes but he's cut back.  M on SSRI. Plan: More anxiety free lately,  fluoxetine 40 mg daily.  Consider reduce it to 30 mg bc might be a littlle too flat .  Social anxiety was there in the summer at times but not really there now.  ? Dulling him out now.  10/10/2020 appt noted: Graduated.  3 job interviews and considering grad school.  Still living in [place] until decides future. Takes  fluoxetine 40 and propranolol 2-3 times per wek very helpful. Almost 5 mos sober now wiehtout alcohol.  Started AA bc couldn't control his drinking.   Doing well.  Anxiety is a lot better.   Is ready to make a adjustment upward in fluoxetine to further reduce social anxiety in large groups.  That flares anxiety. No SE noted. More trouble going to sleep off alcohol.  Asks about sleeper. 2-3 cups coffee daily.  No later than 2 pm. Plan: More anxiety free lately except social situation esp large groups.  Wants increase to  fluoxetine 60 mg daily to see if it can be even better.   12/29/2020 appointment with the following noted: Been amazing with increase Prozac to 60 mg daily.  Propranolol is cherry on top with social and performance anxiety.  "night and day" difference. Initial SE sleep problems but taking at 1 PM and that seems to solve the problems. Good sleep and energy. Anxiety is manageable now. Depression manageable. Maintained sobriety since October last year and that has helped.  Still doing AA and it's helped.  Going to church a lot in [name] and it has helped get life back on track. 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 Wouldn't have been possible if still drinking. Never needed trazodone. Plan: More anxiety free lately except social situation esp large groups.  Wants increase to  fluoxetine 60 mg daily to see if it can be even better.   08/29/2021 appointment with the following noted: Been good.   Sober 15 mos and best decision ever.  Giving meds a chance to let meds help. Still get in his head every now and then with different things.  Practicing gratitude helps. M wanted him to ask about some things.  No problem dating but gets really clingy and that's a problem in relationships.  That hits harder than anything and he things it's partly related to He was adopted.  Had some SI late Sept and early Oct bc rejected by woman he was pursuing for  another friend of his.  Knew his reaction was out of proportion to the situation. Grounded by church and [employer]. Had SI when drinking but not again until the above. Was so insecure until I got sober.    Past Psychiatric Medication Trials: Lexapro sleepy, fluoxetine 60 , propranolol  History counseling after sister died.  Review of Systems:  Review of Systems  Cardiovascular:  Negative for palpitations.  Gastrointestinal:  Negative for abdominal pain.  Neurological:  Negative for dizziness, tremors and weakness.   Medications: I have reviewed the patient's current medications.  Current Outpatient Medications  Medication Sig Dispense Refill   Creatine Monohydrate POWD Take by mouth.     Dietary Management Product (NEOKE BCAA4) POWD Take by mouth.     FLUoxetine (  PROZAC) 20 MG capsule TAKE 3 CAPSULES BY MOUTH EVERY DAY 90 capsule 0   Linoleic Acid-Sunflower Oil (CLA) [phone] MG CAPS Take by mouth.     propranolol (INDERAL) 20 MG tablet Take 1-2 tablets (20-40 mg total) by mouth 2 (two) times daily as needed. 100 tablet 3   No current facility-administered medications for this visit.    Medication Side Effects: None except less sexual interest  Allergies: No Known Allergies  History reviewed. No pertinent past medical history.  Family History  Adopted: Yes    Social History   Socioeconomic History   Marital status: Unknown    Spouse name: Not on file   Number of children: Not on file   Years of education: Not on file   Highest education level: Not on file  Occupational History   Not on file  Tobacco Use   Smoking status: Never   Smokeless tobacco: Never  Substance and Sexual Activity   Alcohol use: No   Drug use: No   Sexual activity: Not on file  Other Topics Concern   Not on file  Social History Narrative   Not on file   Social Determinants of Health   Financial Resource Strain: Not on file  Food Insecurity: Not on file  Transportation Needs: Not on file   Physical Activity: Not on file  Stress: Not on file  Social Connections: Not on file  Intimate Partner Violence: Not on file    Past Medical History, Surgical history, Social history, and Family history were reviewed and updated as appropriate.   Please see review of systems for further details on the patient's review from today.   Objective:   Physical Exam:  There were no vitals taken for this visit.  Physical Exam Constitutional:      General: He is not in acute distress.    Appearance: He is well-developed.  Musculoskeletal:        General: No deformity.  Neurological:     Mental Status: He is alert and oriented to person, place, and time.     Coordination: Coordination normal.  Psychiatric:        Attention and Perception: Attention normal. He is attentive.        Mood and Affect: Mood is not anxious or depressed. Affect is not labile, blunt, angry or inappropriate.        Speech: Speech normal.        Behavior: Behavior normal.        Thought Content: Thought content normal. Thought content does not include homicidal or suicidal ideation. Thought content does not include suicidal plan.        Cognition and Memory: Cognition normal.        Judgment: Judgment normal.     Comments: Insight is good. Anxiety situational is much better But some emotional reactivity    Lab Review:  No results found for: NA, K, CL, CO2, GLUCOSE, BUN, CREATININE, CALCIUM, PROT, ALBUMIN, AST, ALT, ALKPHOS, BILITOT, GFRNONAA, GFRAA  No results found for: WBC, RBC, HGB, HCT, PLT, MCV, MCH, MCHC, RDW, LYMPHSABS, MONOABS, EOSABS, BASOSABS  No results found for: POCLITH, LITHIUM   No results found for: PHENYTOIN, PHENOBARB, VALPROATE, CBMZ   .res Assessment: Plan:    [place] was seen today for follow-up, anxiety and depression.  Diagnoses and all orders for this visit:  Social anxiety disorder  Generalized anxiety disorder  Major depression single episode, in partial remission  (HCC)  Dysthymia  Insomnia due to mental condition  We have switched him from Lexapro to fluoxetine in hopes of reducing side effects of sleepiness and some SE related to drinking alcohol with the Lexapro.  His depression has resolved with the fluoxetine and is not having any sleepiness.  He has residual social anxiety but is manageable with the propranolol and the current meds.  He is tolerating the fluoxetine well.  He would like to go a little higher in the fluoxetine to try to better manage anxiety socially  Disc treatment plan with eventual plan to reduce the fluoxetine at the right time.  Now is not that time.  continue  fluoxetine 60 mg daily .  Answered questions about testosterone and metabolism on SSRI.  Answered questions about weight and SSRI.  He's not craving.  Not working out like he used to bc feels more OK with not working out.  Prn propranolol 20-40 mg social events it helps.  Discussed side effects of each medicine at length.  Good benefit with it.  Disc dosing range.  Trazodone no longer needed.  Disc his drinking and he could not moderate and has suffered serious bad situations.  Disc sobriety tools. Sober 5 mos.  Maintain sobriety.    Trazodone 50-100 mg HS, and if fails.  He doesn't want to try Ambien  Supportive therapy dealing with relationship problems and how the history of addiction relates. Continue counseling.  Call if start having more SI episodes and could consider low dose lithium but it seems premature at this time.  Follow-up 6 mos or so.     Meredith Staggers, MD, DFAPA   Please see After Visit Summary for patient specific instructions.  No future appointments.  No orders of the defined types were placed in this encounter.     -------------------------------

## 2021-09-19 ENCOUNTER — Other Ambulatory Visit: Payer: Self-pay | Admitting: Psychiatry

## 2021-09-19 DIAGNOSIS — F341 Dysthymic disorder: Secondary | ICD-10-CM

## 2021-09-19 DIAGNOSIS — F4010 Social phobia, unspecified: Secondary | ICD-10-CM

## 2021-09-19 DIAGNOSIS — F411 Generalized anxiety disorder: Secondary | ICD-10-CM

## 2021-09-19 DIAGNOSIS — F331 Major depressive disorder, recurrent, moderate: Secondary | ICD-10-CM

## 2021-12-06 ENCOUNTER — Ambulatory Visit: Payer: BC Managed Care – PPO | Admitting: Psychiatry

## 2021-12-19 ENCOUNTER — Ambulatory Visit: Payer: BC Managed Care – PPO | Admitting: Psychiatry

## 2021-12-19 ENCOUNTER — Encounter: Payer: Self-pay | Admitting: Psychiatry

## 2021-12-19 DIAGNOSIS — F411 Generalized anxiety disorder: Secondary | ICD-10-CM | POA: Diagnosis not present

## 2021-12-19 DIAGNOSIS — F341 Dysthymic disorder: Secondary | ICD-10-CM | POA: Diagnosis not present

## 2021-12-19 DIAGNOSIS — F5105 Insomnia due to other mental disorder: Secondary | ICD-10-CM

## 2021-12-19 DIAGNOSIS — F3181 Bipolar II disorder: Secondary | ICD-10-CM

## 2021-12-19 DIAGNOSIS — F4010 Social phobia, unspecified: Secondary | ICD-10-CM

## 2021-12-19 MED ORDER — DIVALPROEX SODIUM ER 500 MG PO TB24: ORAL_TABLET | ORAL | 0 refills | Status: DC

## 2021-12-19 NOTE — Progress Notes (Signed)
Connor Hill ?[id number] ?1996-04-09 ?26 y.o. ?  ? ?Subjective:  ? ?Patient ID:  Connor Hill is a 26 y.o. (DOB 1996-04-04) male. ? ?Chief Complaint:  ?Chief Complaint  ?Patient presents with  ? Follow-up  ? Depression  ? Anxiety  ? ? ?Anxiety ?Patient reports no dizziness or palpitations.  ? ? ?Connor Hill presents to the office today for follow-up of anxiety and depressive symptoms. ? ?At visit in April 2020.  No changes after recent switch to fluoxetine from Lexapro.   ? ?visit in June we increased fluoxetine for anxiety and residual depression from 20 to 30 mg daily.  This was helpful. ? ?seen August 2020.  The following changes were made: ?increase fluoxetine 40 mg for residual anxiety ?Prn propranolol 20-40 mg social events to see if it helps.  Discussed side effects of each medicine at length.  He would like to try the propranolol. ? ?\seen August 18, 2019.  The following was noted: ?He returned to the [employer] for school in August. ?Pretty good overall.  Tried propranolol prn for social settings.  Alcohol with it seemed to increase effects of alcohol.  It did help with social anxiety.   ?Questions about the meds and medical concerns.  Liver enzymes a little elevated.   ?Hasn't gotten grades.  Only concern about 1 of the classes which was hard. ?Increase fluoxetine from 20 to 30 mg huge benefit but 30 to 40 mg less sig benefit  But without SE. Very beneficial. Still No depression.  Anxiety is better but not gone socially Not much other anxiety except some general.  Occ intrusive images of bad things happening. These are better with time also.  A little bit of sweating and a little heart racing.  He tolerated the increase in fluoxetine without side effects. ?He continued fluoxetine 40 mg daily and as needed propranolol 20 to 40 mg. ? ?01/13/2020 appointment the following is noted: ?Not using propranolol that much to keep from relying on  it. ?Prozac 40 mg doing really well and he really likes that dose.  Only SE if takes late it bothers his sleep.  May stay awake all night 2 times weekly.  Sleep is erratic and some daytime drowsiness and naps. ?Patient reports stable mood and denies depressed or irritable moods.  Patient Occ difficulty with sleep initiation or maintenance. Average Denies appetite disturbance.  Patient reports that energy and motivation have been good.  Patient denies any difficulty with concentration.  Patient denies any suicidal ideation. ?Plan : no sig med changes ? ?04/18/20 appt with the following noted: ?Going back to school.  On and off with sadness and moodiness.  Knew he needed to get sober.   Liver labs OK and [place]  Today, but continued abd pain.  Doc said said he needed exam.  Awaiting results.  Got to thinking drinking is affiliated with GI pain but pain continued even when not drinking.  Trying to find other ways to socialize at 26 is hard.  Can't go out with friends and not drink.  So locking himself in on weekends.  Since quit drinking still some stomach pain but not gone.  Realizes he can't moderate drinking.  All it takes is one drink to jump to too many.  Doesn't need it daily.  When I drink I feel like super man and better verbally.  Without it no urge and now words to talk but anxiety is managed.  Not motivated to  talk to people. ?Propranolol more prn without alcohol. ?Summer plans still up in the air but will take summer school from undecided location.   ?Not drinking DT quarantine.  Past alcohol issues with some elevation liver enzymes but he's cut back.  ?M on SSRI. ?Plan: More anxiety free lately,  fluoxetine 40 mg daily.  Consider reduce it to 30 mg bc might be a littlle too flat .  Social anxiety was there in the summer at times but not really there now.  ? Dulling him out now. ? ?10/10/2020 appt noted: ?Graduated.  3 job interviews and considering grad school.  Still living in [place] until decides future. ?Takes  fluoxetine 40 and propranolol 2-3 times per wek very helpful. ?Almost 5 mos sober now wiehtout alcohol.  Started AA bc couldn't control his drinking.   ?Doing well.  Anxiety is a lot better.   ?Is ready to make a adjustment upward in fluoxetine to further reduce social anxiety in large groups.  That flares anxiety. ?No SE noted. ?More trouble going to sleep off alcohol.  Asks about sleeper. ?2-3 cups coffee daily.  No later than 2 pm. ?Plan: More anxiety free lately except social situation esp large groups.  Wants increase to  fluoxetine 60 mg daily to see if it can be even better.  ? ?12/29/2020 appointment with the following noted: ?Been amazing with increase Prozac to 60 mg daily.  Propranolol is cherry on top with social and performance anxiety.  "night and day" difference. ?Initial SE sleep problems but taking at 1 PM and that seems to solve the problems. ?Good sleep and energy. ?Anxiety is manageable now. Depression manageable. ?Maintained sobriety since October last year and that has helped.  Still doing AA and it's helped.  Going to church a lot in [place] and it has helped get life back on track. 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 ?Wouldn't have been possible if still drinking. ?Never needed trazodone. ?Plan: More anxiety free lately except social situation esp large groups.  Wants increase to  fluoxetine 60 mg daily to see if it can be even better.  ? ?08/29/2021 appointment with the following noted: ?Been good.   ?Sober 15 mos and best decision ever.  Giving meds a chance to let meds help. ?Still get in his head every now and then with different things.  Practicing gratitude helps. ?M wanted him to ask about some things.  No problem dating but gets really clingy and that's a problem in relationships.  That hits harder than anything and he things it's partly related to He was adopted.  Had some SI late Sept and early Oct bc rejected by woman he was pursuing for  another friend of his.  Knew his reaction was out of proportion to the situation. ?Grounded by church and AA. ?Had SI when drinking but not again until the above. ?Was so insecure until I got sober.   ?Plan contiinue fluoxetine 60 mg daily and Trazodone 50-100 mg HS,, prn propranolol ? ?12/09/21 appt noted: expedited appt ?No trazodone. ?Today great and peaceful. ?In December got suddenly more depressed and sad and wanted to withdraw from family.  Scared him.  Had fear he might commit suicide even though he didn't want to do it.  Shocking and not sure why.  Lasted a couple of days then gradually better.  Feb had 1-2  week stretch out of the blue sad, depressed and anxious.  Counselor noticed a neck  tic.   ?SE can't cry. ?Fall of last year had hyper almost manic period with much more social and sexual confidence.  Counselor noticed he was almost manic at the time.  Talking to multiple women at the same time.  Can be moody.Says it was because he got sober and confident. ?Fluoxetine helped the prior obsessive intrusive thoughts of stabbing his father ego dydstonic. ? ?Past Psychiatric Medication Trials: Lexapro sleepy, fluoxetine 60 , propranolol, trazodone  ? ?History counseling after sister died. ? ?Review of Systems:  ?Review of Systems  ?Cardiovascular:  Negative for palpitations.  ?Gastrointestinal:  Negative for abdominal pain.  ?Neurological:  Negative for dizziness, tremors and weakness.  ?Psychiatric/Behavioral:  Positive for agitation and dysphoric mood.   ? ?Medications: I have reviewed the patient's current medications. ? ?Current Outpatient Medications  ?Medication Sig Dispense Refill  ? Creatine Monohydrate POWD Take by mouth.    ? Dietary Management Product (NEOKE BCAA4) POWD Take by mouth.    ? FLUoxetine (PROZAC) 20 MG capsule Take 3 capsules (60 mg total) by mouth daily. 270 capsule 1  ? Linoleic Acid-Sunflower Oil (CLA) 9148751063 MG CAPS Take by mouth.    ? propranolol (INDERAL) 20 MG tablet Take 1-2  tablets (20-40 mg total) by mouth 2 (two) times daily as needed. 100 tablet 3  ? divalproex (DEPAKOTE ER) 500 MG 24 hr tablet 1 at night for 5 nights then 2 at night 60 tablet 0  ? ?No current facility-administe

## 2021-12-28 ENCOUNTER — Telehealth: Payer: Self-pay | Admitting: Psychiatry

## 2021-12-28 NOTE — Telephone Encounter (Signed)
Pt LVM at 1:07p.  He wants to discuss Propranolol interaction with Fluoxetine and a beta blocker med.   ? ?Next appt 5/24. ?

## 2021-12-28 NOTE — Telephone Encounter (Signed)
LVM to RC 

## 2021-12-29 NOTE — Telephone Encounter (Signed)
Patient is concerned about potential interactions with the 3 medications you prescribed. He said he is taking them and has had no SE thus far. I do see in your note that this information was discussed with him during your visit.  ?

## 2022-01-08 ENCOUNTER — Ambulatory Visit: Payer: BC Managed Care – PPO | Admitting: Psychiatry

## 2022-01-08 NOTE — Telephone Encounter (Signed)
The only risk is that fluoxetine can increase the blood level of propranolol a little bit.  That is probably not significant.  He has a range of dosing that he can use the propranolol between 1 and 2 tablets.  If he wants to and is worried about it he can take as little as a half of the propranolol.  If he has no side effects then he can use the propranolol in the range of the prescription 1 to 2 tablets without worry.  If it makes him too tired then he is taking too much.  The dose is low.  It is for anxiety or tremor and he can use it as needed. ?

## 2022-01-09 NOTE — Telephone Encounter (Signed)
LVM to RC 

## 2022-01-10 NOTE — Telephone Encounter (Signed)
Left another VM to RC.  

## 2022-01-12 NOTE — Telephone Encounter (Signed)
Was able to reach patient and the information regarding propranolol was provided. Patient said "that is what I needed to know."  ?

## 2022-01-18 ENCOUNTER — Other Ambulatory Visit: Payer: Self-pay | Admitting: Psychiatry

## 2022-01-31 ENCOUNTER — Encounter: Payer: Self-pay | Admitting: Psychiatry

## 2022-01-31 ENCOUNTER — Ambulatory Visit (INDEPENDENT_AMBULATORY_CARE_PROVIDER_SITE_OTHER): Payer: BC Managed Care – PPO | Admitting: Psychiatry

## 2022-01-31 DIAGNOSIS — F341 Dysthymic disorder: Secondary | ICD-10-CM

## 2022-01-31 DIAGNOSIS — F331 Major depressive disorder, recurrent, moderate: Secondary | ICD-10-CM

## 2022-01-31 DIAGNOSIS — F411 Generalized anxiety disorder: Secondary | ICD-10-CM | POA: Diagnosis not present

## 2022-01-31 DIAGNOSIS — F5105 Insomnia due to other mental disorder: Secondary | ICD-10-CM

## 2022-01-31 DIAGNOSIS — F4010 Social phobia, unspecified: Secondary | ICD-10-CM | POA: Diagnosis not present

## 2022-01-31 DIAGNOSIS — F3181 Bipolar II disorder: Secondary | ICD-10-CM | POA: Diagnosis not present

## 2022-01-31 MED ORDER — DIVALPROEX SODIUM ER 500 MG PO TB24: ORAL_TABLET | ORAL | 0 refills | Status: DC

## 2022-01-31 MED ORDER — FLUOXETINE HCL 20 MG PO CAPS: 60.0000 mg | ORAL_CAPSULE | Freq: Every day | ORAL | 0 refills | Status: DC

## 2022-01-31 NOTE — Progress Notes (Signed)
Connor Hill [id number] 1996-08-14 26 y.o.   Virtual Visit via Telephone Note  I connected with pt by telephone and verified that I am speaking with the correct person using two identifiers.   I discussed the limitations, risks, security and privacy concerns of performing an evaluation and management service by telephone and the availability of in person appointments. I also discussed with the patient that there may be a patient responsible charge related to this service. The patient expressed understanding and agreed to proceed.  I discussed the assessment and treatment plan with the patient. The patient was provided an opportunity to ask questions and all were answered. The patient agreed with the plan and demonstrated an understanding of the instructions.   The patient was advised to call back or seek an in-person evaluation if the symptoms worsen or if the condition fails to improve as anticipated.  I provided 30 minutes of non-face-to-face time during this encounter. The call started at 1045 and ended at 1115. The patient was located at home and the provider was located office.   Subjective:   Patient ID:  Connor Hill is a 26 y.o. (DOB January 06, 1996) male.  Chief Complaint:  Chief Complaint  Patient presents with   Follow-up    Anxiety Symptoms include nervous/anxious behavior. Patient reports no dizziness or palpitations.    Connor Hill presents to the office today for follow-up of anxiety and depressive symptoms.  At visit in April 2020.  No changes after recent switch to fluoxetine from Lexapro.    visit in June we increased fluoxetine for anxiety and residual depression from 20 to 30 mg daily.  This was helpful.  seen August 2020.  The following changes were made: increase fluoxetine 40 mg for residual anxiety Prn propranolol 20-40 mg social events to see if it helps.  Discussed side effects of each medicine at  length.  He would like to try the propranolol.  \seen August 18, 2019.  The following was noted: He returned to the [place] for school in August. Pretty good overall.  Tried propranolol prn for social settings.  Alcohol with it seemed to increase effects of alcohol.  It did help with social anxiety.   Questions about the meds and medical concerns.  Liver enzymes a little elevated.   Hasn't gotten grades.  Only concern about 1 of the classes which was hard. Increase fluoxetine from 20 to 30 mg huge benefit but 30 to 40 mg less sig benefit  But without SE. Very beneficial. Still No depression.  Anxiety is better but not gone socially Not much other anxiety except some general.  Occ intrusive images of bad things happening. These are better with time also.  A little bit of sweating and a little heart racing.  He tolerated the increase in fluoxetine without side effects. He continued fluoxetine 40 mg daily and as needed propranolol 20 to 40 mg.  01/13/2020 appointment the following is noted: Not using propranolol that much to keep from relying on it. Prozac 40 mg doing really well and he really likes that dose.  Only SE if takes late it bothers his sleep.  May stay awake all night 2 times weekly.  Sleep is erratic and some daytime drowsiness and naps. Patient reports stable mood and denies depressed or irritable moods.  Patient Occ difficulty with sleep initiation or maintenance. Average Denies appetite disturbance.  Patient reports that energy and motivation have been good.  Patient denies any  difficulty with concentration.  Patient denies any suicidal ideation. Plan : no sig med changes  04/18/20 appt with the following noted: Going back to school.  On and off with sadness and moodiness.  Knew he needed to get sober.   Liver labs OK and [place]  Today, but continued abd pain.  Doc said said he needed exam.  Awaiting results.  Got to thinking drinking is affiliated with GI pain but pain continued  even when not drinking.  Trying to find other ways to socialize at 24 is hard.  Can't go out with friends and not drink.  So locking himself in on weekends.  Since quit drinking still some stomach pain but not gone.  Realizes he can't moderate drinking.  All it takes is one drink to jump to too many.  Doesn't need it daily.  When I drink I feel like super man and better verbally.  Without it no urge and now words to talk but anxiety is managed.  Not motivated to talk to people. Propranolol more prn without alcohol. Summer plans still up in the air but will take summer school from undecided location.   Not drinking DT quarantine.  Past alcohol issues with some elevation liver enzymes but he's cut back.  M on SSRI. Plan: More anxiety free lately,  fluoxetine 40 mg daily.  Consider reduce it to 30 mg bc might be a littlle too flat .  Social anxiety was there in the summer at times but not really there now.  ? Dulling him out now.  10/10/2020 appt noted: Graduated.  3 job interviews and considering grad school.  Still living in [place] until decides future. Takes fluoxetine 40 and propranolol 2-3 times per wek very helpful. Almost 5 mos sober now wiehtout alcohol.  Started AA bc couldn't control his drinking.   Doing well.  Anxiety is a lot better.   Is ready to make a adjustment upward in fluoxetine to further reduce social anxiety in large groups.  That flares anxiety. No SE noted. More trouble going to sleep off alcohol.  Asks about sleeper. 2-3 cups coffee daily.  No later than 2 pm. Plan: More anxiety free lately except social situation esp large groups.  Wants increase to  fluoxetine 60 mg daily to see if it can be even better.   12/29/2020 appointment with the following noted: Been amazing with increase Prozac to 60 mg daily.  Propranolol is cherry on top with social and performance anxiety.  "night and day" difference. Initial SE sleep problems but taking at 1 PM and that seems to solve the  problems. Good sleep and energy. Anxiety is manageable now. Depression manageable. Maintained sobriety since October last year and that has helped.  Still doing AA and it's helped.  Going to church a lot in [place] and it has helped get life back on track. 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 Wouldn't have been possible if still drinking. Never needed trazodone. Plan: More anxiety free lately except social situation esp large groups.  Wants increase to  fluoxetine 60 mg daily to see if it can be even better.   08/29/2021 appointment with the following noted: Been good.   Sober 15 mos and best decision ever.  Giving meds a chance to let meds help. Still get in his head every now and then with different things.  Practicing gratitude helps. M wanted him to ask about some things.  No problem dating but  gets really clingy and that's a problem in relationships.  That hits harder than anything and he things it's partly related to He was adopted.  Had some SI late Sept and early Oct bc rejected by woman he was pursuing for another friend of his.  Knew his reaction was out of proportion to the situation. Grounded by church and [employer]. Had SI when drinking but not again until the above. Was so insecure until I got sober.   Plan contiinue fluoxetine 60 mg daily and Trazodone 50-100 mg HS,, prn propranolol  12/09/21 appt noted: expedited appt No trazodone. Today great and peaceful. In December got suddenly more depressed and sad and wanted to withdraw from family.  Scared him.  Had fear he might commit suicide even though he didn't want to do it.  Shocking and not sure why.  Lasted a couple of days then gradually better.  Feb had 1-2  week stretch out of the blue sad, depressed and anxious.  Counselor noticed a neck tic.   SE can't cry. Fall of last year had hyper almost manic period with much more social and sexual confidence.  Counselor noticed he was almost manic  at the time.  Talking to multiple women at the same time.  Can be moody.Says it was because he got sober and confident. Fluoxetine helped the prior obsessive intrusive thoughts of stabbing his father ego dydstonic. Start Depakote ER 500 mg 1 at night for 5 nights then 2 at night..  Disc SE.  01/31/22 appt noted:  Connor Hill reports.taking fluoxetine 60 and Depakote ER 1000. In beginning more sleepy and lazy and getting down.  But then turning point and started feeling better. Very stable but at the same time. Has a GF now, good.   No manic sx since here. Notices if he forgets Depakote will feel off and jittery. Not depressed or anxious now. Since here had episode of intrusive thoughts that people would yell at him for being ugly if went in public, but these thoughts resolved.  Not anxious around people now. Sleep good. SE sexual drive is low.  Used to be be polar opposite.    Past Psychiatric Medication Trials: Lexapro sleepy, fluoxetine 60 ,  Depakote 1000 propranolol, trazodone   History counseling after sister died.  Review of Systems:  Review of Systems  Cardiovascular:  Negative for palpitations.  Gastrointestinal:  Negative for abdominal pain.  Neurological:  Negative for dizziness, tremors and weakness.  Psychiatric/Behavioral:  Negative for agitation and dysphoric mood. The patient is nervous/anxious.    Medications: I have reviewed the patient's current medications.  Current Outpatient Medications  Medication Sig Dispense Refill   Creatine Monohydrate POWD Take by mouth.     Dietary Management Product (NEOKE BCAA4) POWD Take by mouth.     Linoleic Acid-Sunflower Oil (CLA) [phone] MG CAPS Take by mouth.     propranolol (INDERAL) 20 MG tablet Take 1-2 tablets (20-40 mg total) by mouth 2 (two) times daily as needed. 100 tablet 3   divalproex (DEPAKOTE ER) 500 MG 24 hr tablet TAKE 2 TABLETS AT NIGHT 180 tablet 0   FLUoxetine (PROZAC) 20 MG capsule Take 3 capsules (60 mg total)  by mouth daily. 180 capsule 0   No current facility-administered medications for this visit.    Medication Side Effects: None except less sexual interest  Allergies: No Known Allergies  History reviewed. No pertinent past medical history.  Family History  Adopted: Yes    Social History   Socioeconomic  History   Marital status: Unknown    Spouse name: Not on file   Number of children: Not on file   Years of education: Not on file   Highest education level: Not on file  Occupational History   Not on file  Tobacco Use   Smoking status: Never   Smokeless tobacco: Never  Substance and Sexual Activity   Alcohol use: No   Drug use: No   Sexual activity: Not on file  Other Topics Concern   Not on file  Social History Narrative   Not on file   Social Determinants of Health   Financial Resource Strain: Not on file  Food Insecurity: Not on file  Transportation Needs: Not on file  Physical Activity: Not on file  Stress: Not on file  Social Connections: Not on file  Intimate Partner Violence: Not on file    Past Medical History, Surgical history, Social history, and Family history were reviewed and updated as appropriate.   Please see review of systems for further details on the patient's review from today.   Objective:   Physical Exam:  There were no vitals taken for this visit.  Physical Exam Constitutional:      General: He is not in acute distress.    Appearance: He is well-developed.  Musculoskeletal:        General: No deformity.  Neurological:     Mental Status: He is alert and oriented to person, place, and time.     Coordination: Coordination normal.  Psychiatric:        Attention and Perception: Attention normal. He is attentive.        Mood and Affect: Mood is not anxious or depressed. Affect is not labile, blunt, angry or inappropriate.        Speech: Speech normal.        Behavior: Behavior normal.        Thought Content: Thought content normal.  Thought content is not delusional. Thought content does not include homicidal or suicidal ideation. Thought content does not include suicidal plan.        Cognition and Memory: Cognition normal.        Judgment: Judgment normal.     Comments: Insight is good. Anxiety situational is much better No mood swings.    Lab Review:  No results found for: NA, K, CL, CO2, GLUCOSE, BUN, CREATININE, CALCIUM, PROT, ALBUMIN, AST, ALT, ALKPHOS, BILITOT, GFRNONAA, GFRAA  No results found for: WBC, RBC, HGB, HCT, PLT, MCV, MCH, MCHC, RDW, LYMPHSABS, MONOABS, EOSABS, BASOSABS  No results found for: POCLITH, LITHIUM   No results found for: PHENYTOIN, PHENOBARB, VALPROATE, CBMZ   .res Assessment: Plan:    Andrea was seen today for follow-up.  Diagnoses and all orders for this visit:  Bipolar II disorder (HCC) -     divalproex (DEPAKOTE ER) 500 MG 24 hr tablet; TAKE 2 TABLETS AT NIGHT  Social anxiety disorder -     FLUoxetine (PROZAC) 20 MG capsule; Take 3 capsules (60 mg total) by mouth daily.  Generalized anxiety disorder -     FLUoxetine (PROZAC) 20 MG capsule; Take 3 capsules (60 mg total) by mouth daily.  Dysthymia -     FLUoxetine (PROZAC) 20 MG capsule; Take 3 capsules (60 mg total) by mouth daily.  Insomnia due to mental condition  Major depressive disorder, recurrent episode, moderate (HCC) -     FLUoxetine (PROZAC) 20 MG capsule; Take 3 capsules (60 mg total) by mouth daily.  Greater than 50% of 45 min face to face time with patient was spent on counseling and coordination of care. We discussed New likely dx bipolar 2 vs med induced hypomania and rapid cycling of depression since the fall.  Disc this at length and the dx.  Disc his fear of dx of bipolar.  Disc this at length.  Disc mood stabilizer options.  Much better so far with Depakote  He has residual social anxiety which resolved so far.  better manage anxiety socially markedly.  Rec mood stabilizer   BC benefit fluoxetine  then ok to continue but needs mood stabilizer.  BC rapid cycling.  It's helped stability so continue Depakote ER 500 mg 2 at night or can split dose.Marland Kitchen.  Disc SE.  Disc treatment plan with eventual plan to reduce the fluoxetine at the right time.    Reduce  fluoxetine 40 mg daily because he reports this helped with obsessive harm thoughts which were ego-dystonic but is having sexual SE and it might cause mood cycling.   He may also have OCD. Answered questions about testosterone and metabolism on SSRI.  Answered questions about weight and SSRI.  He's not craving.  Not working out like he used to bc feels more OK with not working out. Disc sexual SE fluoxetine.  Will plan slow taper.  Prn propranolol 20-40 mg social events it helps.  Discussed side effects of each medicine at length.  Good benefit with it.  Disc dosing range.  Not using it.  Disc DDI with fluoxetine.  Disc SE.  Trazodone no longer needed.  Disc his drinking and he could not moderate and has suffered serious bad situations.  Disc sobriety tools. Sober 5 mos.  Maintain sobriety.    Trazodone 50-100 mg HS, and if fails.  He doesn't want to try Ambien  Supportive therapy dealing with relationship problems and how the history of addiction relates. Continue counseling.  Call if start having more SI episodes and could consider low dose lithium but it seems premature at this time.  Follow-up 2 mos .     Meredith Staggersarey Cottle, MD, DFAPA   Please see After Visit Summary for patient specific instructions.  No future appointments.  No orders of the defined types were placed in this encounter.     -------------------------------

## 2022-02-17 ENCOUNTER — Other Ambulatory Visit: Payer: Self-pay | Admitting: Psychiatry

## 2022-02-17 DIAGNOSIS — F3181 Bipolar II disorder: Secondary | ICD-10-CM

## 2022-02-20 NOTE — Telephone Encounter (Signed)
Lvm for pt to call and schedule

## 2022-02-20 NOTE — Telephone Encounter (Signed)
Please schedule appt

## 2022-03-20 ENCOUNTER — Other Ambulatory Visit: Payer: Self-pay | Admitting: Psychiatry

## 2022-03-20 DIAGNOSIS — F4010 Social phobia, unspecified: Secondary | ICD-10-CM

## 2022-03-20 DIAGNOSIS — F411 Generalized anxiety disorder: Secondary | ICD-10-CM

## 2022-03-20 DIAGNOSIS — F331 Major depressive disorder, recurrent, moderate: Secondary | ICD-10-CM

## 2022-03-20 DIAGNOSIS — F341 Dysthymic disorder: Secondary | ICD-10-CM

## 2022-03-20 NOTE — Telephone Encounter (Signed)
Please call patient to schedule F/U. Last seen 5/24 with RTC in 2 mo. Had appt in August but canceled it.

## 2022-03-20 NOTE — Telephone Encounter (Signed)
Pt LVM that he was calling to schedule appt and change his pharmacy.  He said to call him back between 4:30p and 5p. I called at 4:51p and had to LVM.

## 2022-03-20 NOTE — Telephone Encounter (Signed)
LVM to [facility] - what dose is he taking. Last note mentions reducing to 40 mg

## 2022-03-21 ENCOUNTER — Other Ambulatory Visit: Payer: Self-pay | Admitting: Psychiatry

## 2022-03-21 DIAGNOSIS — F3181 Bipolar II disorder: Secondary | ICD-10-CM

## 2022-03-21 NOTE — Telephone Encounter (Signed)
Please call to schedule appt. Last seen 5/24 with RTC in 3 mo. Canceled 8/31 appt.

## 2022-03-21 NOTE — Telephone Encounter (Signed)
Lvm for pt to call and schedule

## 2022-03-22 ENCOUNTER — Telehealth: Payer: Self-pay | Admitting: Psychiatry

## 2022-03-23 NOTE — Telephone Encounter (Signed)
error 

## 2022-04-17 DIAGNOSIS — R748 Abnormal levels of other serum enzymes: Secondary | ICD-10-CM | POA: Diagnosis not present

## 2022-04-17 DIAGNOSIS — N5319 Other ejaculatory dysfunction: Secondary | ICD-10-CM | POA: Diagnosis not present

## 2022-04-17 DIAGNOSIS — Z87898 Personal history of other specified conditions: Secondary | ICD-10-CM | POA: Diagnosis not present

## 2022-04-17 DIAGNOSIS — Z0001 Encounter for general adult medical examination with abnormal findings: Secondary | ICD-10-CM | POA: Diagnosis not present

## 2022-04-17 DIAGNOSIS — E669 Obesity, unspecified: Secondary | ICD-10-CM | POA: Diagnosis not present

## 2022-04-17 DIAGNOSIS — M545 Low back pain, unspecified: Secondary | ICD-10-CM | POA: Diagnosis not present

## 2022-04-17 DIAGNOSIS — Z113 Encounter for screening for infections with a predominantly sexual mode of transmission: Secondary | ICD-10-CM | POA: Diagnosis not present

## 2022-04-23 DIAGNOSIS — Z7289 Other problems related to lifestyle: Secondary | ICD-10-CM | POA: Diagnosis not present

## 2022-04-23 DIAGNOSIS — N529 Male erectile dysfunction, unspecified: Secondary | ICD-10-CM | POA: Diagnosis not present

## 2022-04-23 DIAGNOSIS — Z0001 Encounter for general adult medical examination with abnormal findings: Secondary | ICD-10-CM | POA: Diagnosis not present

## 2022-05-01 ENCOUNTER — Telehealth: Payer: Self-pay | Admitting: Psychiatry

## 2022-05-01 NOTE — Telephone Encounter (Signed)
Pt LVM @ 10:43a.  He said he wanted to talk about his meds but that he also really needed to talk to Dr. Jennelle Human asap.  I asked Clarisse Gouge to call him asap.

## 2022-05-01 NOTE — Telephone Encounter (Signed)
LVM to RC 

## 2022-05-01 NOTE — Telephone Encounter (Signed)
Patient called asking to speak to you ASAP. I called and had to LVM.  I was eventually able to speak with him. He said for various reasons he has had to cancel/RS appts. He is now not scheduled to be seen until October. We have put him on the cancellation list as high priority. He is c/o depression 5-6/10, anxiety 5-6/10 today. He said he is paranoid, seeing things that aren't there, reading things into situations that don't exist. Asked if he was hearing voices that told him to hurt himself and he denied.  He is not currently suicidal but verbalized thoughts of suicide recently. He said he had a friend come over and sleep on his couch to get him thru it. He said he has even thought of going inpatient. He always seems to have a flat affect, but said he couldn't believe he was as calm today as he was. He is in [facility] and I gave him emergency resources should he have SI again (988, 911, ER).  After questioning him it seems this was precipitated after he saw something in his GF's phone that made him think she was cheating on him. He said he broke up with her several times. I'm not sure what the status of the relationship is at this point.

## 2022-05-01 NOTE — Telephone Encounter (Signed)
Rx had been filled for 2 months to get him to his appt with Dr. Jennelle Human. He should have medication available but he can't find it. I told him I could send in a new Rx but that insurance would likely not pay for it. He said he would look to see if he could find it.

## 2022-05-01 NOTE — Telephone Encounter (Signed)
Next appt: 10/3

## 2022-05-01 NOTE — Telephone Encounter (Signed)
Pt called back at 2:45p.  He would like a refill on Fluoxetine to  [id number] - [place], [place] - [address] [place]  [address] [place] [address]  Phone:  [phone]  Fax:  [phone]

## 2022-05-02 NOTE — Telephone Encounter (Signed)
What meds is is he currently taking is alsways necessary info.  Has he been consisistent?  Was taking Depakote and fluoxetine.  Taking both?

## 2022-05-03 ENCOUNTER — Other Ambulatory Visit: Payer: Self-pay

## 2022-05-03 DIAGNOSIS — N5319 Other ejaculatory dysfunction: Secondary | ICD-10-CM | POA: Diagnosis not present

## 2022-05-03 DIAGNOSIS — K4090 Unilateral inguinal hernia, without obstruction or gangrene, not specified as recurrent: Secondary | ICD-10-CM | POA: Diagnosis not present

## 2022-05-03 DIAGNOSIS — G8929 Other chronic pain: Secondary | ICD-10-CM | POA: Diagnosis not present

## 2022-05-03 DIAGNOSIS — M545 Low back pain, unspecified: Secondary | ICD-10-CM | POA: Diagnosis not present

## 2022-05-03 MED ORDER — ARIPIPRAZOLE 15 MG PO TABS: 7.5000 mg | ORAL_TABLET | Freq: Every day | ORAL | 0 refills | Status: DC

## 2022-05-03 NOTE — Telephone Encounter (Signed)
Rx sent. LVM for patient to [facility].

## 2022-05-03 NOTE — Telephone Encounter (Signed)
Fluoxetine and Depakote will not help underlying paranoia.  I'd recommend he add Abilify 15 mg 1/2 tablet daily to help until he can get in to see me.  Then we can reevaluate

## 2022-05-03 NOTE — Telephone Encounter (Signed)
Thank you. Will send in Rx and notify patient.

## 2022-05-03 NOTE — Telephone Encounter (Signed)
Patient is taking fluoxetine and Depakote consistently as prescribed. He only takes the propranolol prn. Patient was brighter yesterday. He said he had therapy yesterday, which helped. He is back with the GF. His main concern is the "underlying paranoia." He had misplaced his fluoxetine and was requesting a refill. I told him we could RF but that insurance probably would not cover. He decided he would look for it another day and he was able to find it.

## 2022-05-04 NOTE — Telephone Encounter (Signed)
Attempted to call patient.   Mailbox is full.

## 2022-05-04 NOTE — Telephone Encounter (Signed)
Was able to reach patient. He was aware of Rx and I told him I had only sent in 30-day supply to see how he did. Asked how he was feeling today and he said an hour previously he wasn't doing well, but now he felt better, more optimistic.

## 2022-05-10 ENCOUNTER — Ambulatory Visit: Payer: BC Managed Care – PPO | Admitting: Psychiatry

## 2022-05-17 ENCOUNTER — Other Ambulatory Visit: Payer: Self-pay | Admitting: Psychiatry

## 2022-05-17 DIAGNOSIS — F341 Dysthymic disorder: Secondary | ICD-10-CM

## 2022-05-17 DIAGNOSIS — F411 Generalized anxiety disorder: Secondary | ICD-10-CM

## 2022-05-17 DIAGNOSIS — F331 Major depressive disorder, recurrent, moderate: Secondary | ICD-10-CM

## 2022-05-17 DIAGNOSIS — F4010 Social phobia, unspecified: Secondary | ICD-10-CM

## 2022-05-19 ENCOUNTER — Other Ambulatory Visit: Payer: Self-pay | Admitting: Psychiatry

## 2022-05-19 DIAGNOSIS — F3181 Bipolar II disorder: Secondary | ICD-10-CM

## 2022-05-23 ENCOUNTER — Telehealth: Payer: Self-pay | Admitting: Psychiatry

## 2022-05-23 MED ORDER — ARIPIPRAZOLE 15 MG PO TABS: 7.5000 mg | ORAL_TABLET | Freq: Every day | ORAL | 0 refills | Status: DC

## 2022-05-23 NOTE — Telephone Encounter (Signed)
Patient just wants a RF of Abilify and wants to know if he should still take it. Asked him if he felt it was beneficial and he said he did, but would talk about it further with Dr. Jennelle Human.

## 2022-05-23 NOTE — Telephone Encounter (Signed)
Pt lvm thathe has a question about his medications and wants to make sure he is taking them correctly. Please call him at [phone]

## 2022-05-23 NOTE — Telephone Encounter (Signed)
Rx sent 

## 2022-06-12 ENCOUNTER — Ambulatory Visit: Payer: BC Managed Care – PPO | Admitting: Psychiatry

## 2022-06-12 ENCOUNTER — Encounter: Payer: Self-pay | Admitting: Psychiatry

## 2022-06-12 DIAGNOSIS — F411 Generalized anxiety disorder: Secondary | ICD-10-CM

## 2022-06-12 DIAGNOSIS — F22 Delusional disorders: Secondary | ICD-10-CM

## 2022-06-12 DIAGNOSIS — F3181 Bipolar II disorder: Secondary | ICD-10-CM | POA: Diagnosis not present

## 2022-06-12 DIAGNOSIS — F4010 Social phobia, unspecified: Secondary | ICD-10-CM | POA: Diagnosis not present

## 2022-06-12 DIAGNOSIS — F341 Dysthymic disorder: Secondary | ICD-10-CM

## 2022-06-12 DIAGNOSIS — F5105 Insomnia due to other mental disorder: Secondary | ICD-10-CM

## 2022-06-12 MED ORDER — ARIPIPRAZOLE 15 MG PO TABS: 15.0000 mg | ORAL_TABLET | Freq: Every day | ORAL | 0 refills | Status: DC

## 2022-06-12 NOTE — Progress Notes (Signed)
Connor Hill [place] [id number] 27-May-1996 26 y.o.     Subjective:   Patient ID:  Connor Hill Connor Hill is a 26 y.o. (DOB 27-May-1996) male.  Chief Complaint:  Chief Complaint  Patient presents with   Anxiety   Depression   Follow-up    Bipolar II disorder (HCC)    Anxiety Symptoms include nervous/anxious behavior. Patient reports no palpitations.    Depression        Past medical history includes anxiety.    Connor Hill [name] presents to the office today for follow-up of anxiety and depressive symptoms.  At visit in April 2020.  No changes after recent switch to fluoxetine from Lexapro.    visit in June we increased fluoxetine for anxiety and residual depression from 20 to 30 mg daily.  This was helpful.  seen August 2020.  The following changes were made: increase fluoxetine 40 mg for residual anxiety Prn propranolol 20-40 mg social events to see if it helps.  Discussed side effects of each medicine at length.  He would like to try the propranolol.  \seen August 18, 2019.  The following was noted: [place]. Pretty good overall.  Tried propranolol prn for social settings.  Alcohol with it seemed to increase effects of alcohol.  It did help with social anxiety.   Questions about the meds and medical concerns.  Liver enzymes a little elevated.   Hasn't gotten grades.  Only concern about 1 of the classes which was hard. Increase fluoxetine from 20 to 30 mg huge benefit but 30 to 40 mg less sig benefit  But without SE. Very beneficial. Still No depression.  Anxiety is better but not gone socially Not much other anxiety except some general.  Occ intrusive images of bad things happening. These are better with time also.  A little bit of sweating and a little heart racing.  He tolerated the increase in fluoxetine without side effects. He continued fluoxetine 40 mg daily and as needed propranolol 20  to 40 mg.  01/13/2020 appointment the following is noted: Not using propranolol that much to keep from relying on it. Prozac 40 mg doing really well and he really likes that dose.  Only SE if takes late it bothers his sleep.  May stay awake all night 2 times weekly.  Sleep is erratic and some daytime drowsiness and naps. Patient reports stable mood and denies depressed or irritable moods.  Patient Occ difficulty with sleep initiation or maintenance. Average Denies appetite disturbance.  Patient reports that energy and motivation have been good.  Patient denies any difficulty with concentration.  Patient denies any suicidal ideation. Plan : no sig med changes  04/18/20 appt with the following noted: Going back to school.  On and off with sadness and moodiness.  Knew he needed to get sober.   Liver labs OK and US  Today, but continued abd pain.  Doc said said he needed exam.  Awaiting results.  Got to thinking drinking is affiliated with GI pain but pain continued even when not drinking.  Trying to find other ways to socialize at 24 is hard.  Can't go out with friends and not drink.  So locking himself in on weekends.  Since quit drinking still some stomach pain but not gone.  Realizes he can't moderate drinking.  All it takes is one drink to jump to too many.  Doesn't need it daily.  When I drink  I feel like super man and better verbally.  Without it no urge and now words to talk but anxiety is managed.  Not motivated to talk to people. Propranolol more prn without alcohol. Summer plans still up in the air but will take summer school from undecided location.   Not drinking DT quarantine.  Past alcohol issues with some elevation liver enzymes but he's cut back.  M on SSRI. Plan: More anxiety free lately,  fluoxetine 40 mg daily.  Consider reduce it to 30 mg bc might be a littlle too flat .  Social anxiety was there in the summer at times but not really there now.  ? Dulling him out now.  10/10/2020 appt  noted: Graduated.  3 job interviews and considering grad school.  Still living in [place] until decides future. Takes fluoxetine 40 and propranolol 2-3 times per wek very helpful. Almost 5 mos sober now wiehtout alcohol.  Started AA bc couldn't control his drinking.   Doing well.  Anxiety is a lot better.   Is ready to make a adjustment upward in fluoxetine to further reduce social anxiety in large groups.  That flares anxiety. No SE noted. More trouble going to sleep off alcohol.  Asks about sleeper. 2-3 cups coffee daily.  No later than 2 pm. Plan: More anxiety free lately except social situation esp large groups.  Wants increase to  fluoxetine 60 mg daily to see if it can be even better.   12/29/2020 appointment with the following noted: Been amazing with increase Prozac to 60 mg daily.  Propranolol is cherry on top with social and performance anxiety.  "night and day" difference. Initial SE sleep problems but taking at 1 PM and that seems to solve the problems. Good sleep and energy. Anxiety is manageable now. Depression manageable. Maintained sobriety since October last year and that has helped.  Still doing AA and it's helped.  Going to church a lot in [place] and it has helped get life back on track. 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 Wouldn't have been possible if still drinking. Never needed trazodone. Plan: More anxiety free lately except social situation esp large groups.  Wants increase to  fluoxetine 60 mg daily to see if it can be even better.   08/29/2021 appointment with the following noted: Been good.   Sober 15 mos and best decision ever.  Giving meds a chance to let meds help. Still get in his head every now and then with different things.  Practicing gratitude helps. M wanted him to ask about some things.  No problem dating but gets really clingy and that's a problem in relationships.  That hits harder than anything and he things  it's partly related to He was adopted.  Had some SI late Sept and early Oct bc rejected by woman he was pursuing for another friend of his.  Knew his reaction was out of proportion to the situation. Grounded by church and [employer]. Had SI when drinking but not again until the above. Was so insecure until I got sober.   Plan contiinue fluoxetine 60 mg daily and Trazodone 50-100 mg HS,, prn propranolol  12/09/21 appt noted: expedited appt No trazodone. Today great and peaceful. In December got suddenly more depressed and sad and wanted to withdraw from family.  Scared him.  Had fear he might commit suicide even though he didn't want to do it.  Shocking and not sure why.  Lasted a  couple of days then gradually better.  Feb had 1-2  week stretch out of the blue sad, depressed and anxious.  Counselor noticed a neck tic.   SE can't cry. Fall of last year had hyper almost manic period with much more social and sexual confidence.  Counselor noticed he was almost manic at the time.  Talking to multiple women at the same time.  Can be moody.Says it was because he got sober and confident. Fluoxetine helped the prior obsessive intrusive thoughts of stabbing his father ego dydstonic. Start Depakote ER 500 mg 1 at night for 5 nights then 2 at night..  Disc SE.  01/31/22 appt noted:  Steffanie Dunn reports.taking fluoxetine 60 and Depakote ER 1000. In beginning more sleepy and lazy and getting down.  But then turning point and started feeling better. Very stable but at the same time. Has a GF now, good.   No manic sx since here. Notices if he forgets Depakote will feel off and jittery. Not depressed or anxious now. Since here had episode of intrusive thoughts that people would yell at him for being ugly if went in public, but these thoughts resolved.  Not anxious around people now. Sleep good. SE sexual drive is low.  Used to be be polar opposite.    05/03/22 TC: Patient is taking fluoxetine and Depakote consistently as  prescribed. He only takes the propranolol prn. Patient was brighter yesterday. He said he had therapy yesterday, which helped. He is back with the GF. His main concern is the "underlying paranoia." He had misplaced his fluoxetine and was requesting a refill. I told him we could RF but that insurance probably would not cover. He decided he would look for it another day and he was able to find it.    06/12/22 appt noted: Had episode of worsening depression and anxiety with some underlying paranoia.  Had relationship that became toxic and then had SI.  Always had some underlying paranoia with intrusive thoughts of bad things happening to family.  Seeing kinfe and afraid family would fall on it. Ex as soon as had GF then immediately had severe anxiety she would cheat on him to the point of throwing up in the AM.  Broke into her phone and was so positive she would cheat on him.  My brain had to know that I was right.  At peace with ended relationship 3 days ago. 7th grade was positive world would end in 2012.  Generated a lot of fear.  Willing to admit I am delusional.  Others have commented on his "crazy thoughts".   Started Abilify 7.5 mg with slight relief of crazy thoughts a a little more tranquil.  No SE now. Not as much depression.  Last SI 3-4 weeks.   Shouldn't have cut down on fluoxetine from 60 to 40 mg bc felt worse.  Sexual SE with 60 mg daily was sig.     Past Psychiatric Medication Trials: Lexapro sleepy, fluoxetine 60 ,  Depakote 1000 propranolol, trazodone   History counseling after sister died. Adopted at 22 mos old and doesn't know family history.  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  Gastrointestinal:  Negative for abdominal pain.  Neurological:  Negative for tremors and weakness.  Psychiatric/Behavioral:  Positive for depression. Negative for agitation and dysphoric mood. The patient is  nervous/anxious.     Medications: I have reviewed the patient's current medications.  Current Outpatient Medications  Medication Sig Dispense Refill   Creatine Monohydrate POWD Take by mouth.     Dietary Management Product (NEOKE BCAA4) POWD Take by mouth.     divalproex (DEPAKOTE ER) 500 MG 24 hr tablet TAKE 2 TABLETS BY MOUTH EVERY NIGHT 60 tablet 1   FLUoxetine (PROZAC) 20 MG capsule TAKE 2 CAPSULES BY MOUTH EVERY DAY 120 capsule 0   Linoleic Acid-Sunflower Oil (CLA) [phone] MG CAPS Take by mouth.     propranolol (INDERAL) 20 MG tablet Take 1-2 tablets (20-40 mg total) by mouth 2 (two) times daily as needed. 100 tablet 3   ARIPiprazole (ABILIFY) 15 MG tablet Take 1 tablet (15 mg total) by mouth daily. 90 tablet 0   No current facility-administered medications for this visit.    Medication Side Effects: None except less sexual interest  Allergies: No Known Allergies  History reviewed. No pertinent past medical history.  Family History  Adopted: Yes    Social History   Socioeconomic History   Marital status: Unknown    Spouse name: Not on file   Number of children: Not on file   Years of education: Not on file   Highest education level: Not on file  Occupational History   Not on file  Tobacco Use   Smoking status: Never   Smokeless tobacco: Never  Substance and Sexual Activity   Alcohol use: No   Drug use: No   Sexual activity: Not on file  Other Topics Concern   Not on file  Social History Narrative   Not on file   Social Determinants of Health   Financial Resource Strain: Not on file  Food Insecurity: Not on file  Transportation Needs: Not on file  Physical Activity: Not on file  Stress: Not on file  Social Connections: Not on file  Intimate Partner Violence: Not on file    Past Medical History, Surgical history, Social history, and Family history were reviewed and updated as appropriate.   Please see review of systems for further details on the  patient's review from today.   Objective:   Physical Exam:  There were no vitals taken for this visit.  Physical Exam Constitutional:      General: He is not in acute distress.    Appearance: He is well-developed.  Musculoskeletal:        General: No deformity.  Neurological:     Mental Status: He is alert and oriented to person, place, and time.     Coordination: Coordination normal.  Psychiatric:        Attention and Perception: Attention normal. He is attentive.        Mood and Affect: Mood is not anxious or depressed. Affect is not labile, blunt, angry or inappropriate.        Speech: Speech normal.        Behavior: Behavior normal. Behavior is not aggressive.        Thought Content: Thought content is paranoid. Thought content does not include homicidal or suicidal ideation. Thought content does not include suicidal plan.        Cognition and Memory: Cognition normal.        Judgment: Judgment normal.     Comments: Insight is good. Recent severe depression largely resolved. No mood swings. Talkative with questions without pressure.     Lab Review:  No results found for: "NA", "K", "CL", "CO2", "GLUCOSE", "BUN", "CREATININE", "CALCIUM", "PROT", "ALBUMIN", "AST", "ALT", "ALKPHOS", "BILITOT", "GFRNONAA", "GFRAA"  No results  found for: "WBC", "RBC", "HGB", "HCT", "PLT", "MCV", "MCH", "MCHC", "RDW", "LYMPHSABS", "MONOABS", "EOSABS", "BASOSABS"  No results found for: "POCLITH", "LITHIUM"   No results found for: "PHENYTOIN", "PHENOBARB", "VALPROATE", "CBMZ"   .res Assessment: Plan:    Jontae was seen today for anxiety, depression and follow-up.  Diagnoses and all orders for this visit:  Bipolar II disorder (HCC) -     ARIPiprazole (ABILIFY) 15 MG tablet; Take 1 tablet (15 mg total) by mouth daily.  Generalized anxiety disorder -     ARIPiprazole (ABILIFY) 15 MG tablet; Take 1 tablet (15 mg total) by mouth daily.  Paranoia (HCC) -     ARIPiprazole (ABILIFY) 15 MG  tablet; Take 1 tablet (15 mg total) by mouth daily.  Social anxiety disorder  Dysthymia  Insomnia due to mental condition  Greater than 50% of 45 min face to face time with patient was spent on counseling and coordination of care. We discussed New likely dx bipolar 2 vs med induced hypomania and rapid cycling of depression since the fall.  Disc this at length and the dx.  Disc his fear of dx of bipolar.  Disc this at length.  Disc mood stabilizer options.  Much better so far with Depakote  He has residual social anxiety which resolved so far.  better manage anxiety socially markedly.  Rec mood stabilizer   BC benefit fluoxetine then ok to continue but needs mood stabilizer.  BC rapid cycling.  It's helped stability so continue Depakote ER 500 mg 2 at night or can split dose.Marland Kitchen  Disc SE.  Start Abilify 7.5 mg daily and increase to 15 mg as mood stabilizer and paranoia.  Disc treatment plan with eventual plan to reduce the fluoxetine at the right time.    Reduce  fluoxetine 40 mg daily because he reports this helped with obsessive harm thoughts which were ego-dystonic but is having sexual SE and it might cause mood cycling.   He may also have OCD. Answered questions about testosterone and metabolism on SSRI.  Answered questions about weight and SSRI.  He's not craving.  Not working out like he used to bc feels more OK with not working out. Disc sexual SE fluoxetine.  Will plan slow taper.  Prn propranolol 20-40 mg social events it helps.  Discussed side effects of each medicine at length.  Good benefit with it.  Disc dosing range.  Not using it.  Disc DDI with fluoxetine.  Disc SE.  Trazodone no longer needed.  Disc his drinking and he could not moderate and has suffered serious bad situations.  Disc sobriety tools. Sober 5 mos.  Maintain sobriety.    Trazodone 50-100 mg HS, and if fails.  He doesn't want to try Ambien  Supportive therapy dealing with relationship problems and how the  history of addiction relates. Continue counseling.  Call if start having more SI episodes and could consider low dose lithium but it seems premature at this time. Work on Pharmacist, community, codependency and paranoia.  Disc ways to get right counseling for this.  Has been having substance abuse only.  Follow-up 2 mos .     Meredith Staggers, MD, DFAPA   Please see After Visit Summary for patient specific instructions.  Future Appointments  Date Time Provider Department Center  08/30/2022 10:30 AM Cottle, Steva Ready., MD [facility] None    No orders of the defined types were placed in this encounter.     -------------------------------

## 2022-06-18 ENCOUNTER — Telehealth: Payer: Self-pay | Admitting: Psychiatry

## 2022-06-18 NOTE — Telephone Encounter (Signed)
Please see message. He saw Dr. Clovis Pu last week. I do not see Abilify mentioned in that note. Patient had previously been taking 1/2 tablet.

## 2022-06-18 NOTE — Telephone Encounter (Signed)
Rtc to pt and he reports someone called him earlier and told him Dr. Clovis Pu wouldn't be back until Wednesday.   Informed him I read the last office note on 06/12/22 and Dr. Clovis Pu reports pt taking Abilify 7.5 mg with no SE and was helping his thoughts. Pt reports he did mention having restlessness with the medication to him but wasn't sure what happen. He reports taking a whole tablet 15 mg Abilify now, which exacerbated his restlessness. I advised him to go back down to 1/2 tablet (7.5 mg) Abilify until I discuss further with Dr. Clovis Pu. He was very Patent attorney.

## 2022-06-18 NOTE — Telephone Encounter (Signed)
Noted will review 

## 2022-06-18 NOTE — Telephone Encounter (Signed)
Pt lvm that the abilify 15 mg is making his restlessness worse. He is unable to sit for very long  and it is making it hard to work. He said that he went up to a full pill and it is worse. Please call him and let him know what to do. [phone]

## 2022-06-20 ENCOUNTER — Other Ambulatory Visit: Payer: Self-pay

## 2022-06-20 MED ORDER — RISPERIDONE 1 MG PO TABS: 1.0000 mg | ORAL_TABLET | Freq: Every day | ORAL | 0 refills | Status: DC

## 2022-06-20 NOTE — Telephone Encounter (Signed)
LVM to RC. Sent in Rx for risperidone.

## 2022-06-20 NOTE — Telephone Encounter (Signed)
Stop Abilify and wait till the restlessness resolves.  Then start risperidone 1 mg tablets one half at night for 1 week then 1 tablet at night in its place.  That should not cause restlessness but should help him with his intrusive thoughts.

## 2022-06-21 ENCOUNTER — Telehealth: Payer: Self-pay | Admitting: Psychiatry

## 2022-06-21 NOTE — Telephone Encounter (Signed)
Pt LVM @ 2:40p.  He said he was returning a call from [place].  He wants to talk to her about the serious side effects he is having from his medicine.  Next appt 12/21

## 2022-06-21 NOTE — Telephone Encounter (Signed)
Tried contacting patient again, LVM.

## 2022-06-21 NOTE — Telephone Encounter (Signed)
LVM to RC 

## 2022-06-21 NOTE — Telephone Encounter (Signed)
Left another VM to RC.  

## 2022-06-22 NOTE — Telephone Encounter (Signed)
Notified patient of recommendations and Rx.

## 2022-06-22 NOTE — Telephone Encounter (Signed)
Addressed in a different note. Patient has been notified.

## 2022-06-27 NOTE — Telephone Encounter (Signed)
Noted  

## 2022-07-17 ENCOUNTER — Telehealth: Payer: Self-pay | Admitting: Psychiatry

## 2022-07-17 ENCOUNTER — Other Ambulatory Visit: Payer: Self-pay | Admitting: Psychiatry

## 2022-07-17 DIAGNOSIS — F341 Dysthymic disorder: Secondary | ICD-10-CM

## 2022-07-17 DIAGNOSIS — F4010 Social phobia, unspecified: Secondary | ICD-10-CM

## 2022-07-17 DIAGNOSIS — F331 Major depressive disorder, recurrent, moderate: Secondary | ICD-10-CM

## 2022-07-17 DIAGNOSIS — F411 Generalized anxiety disorder: Secondary | ICD-10-CM

## 2022-07-17 NOTE — Telephone Encounter (Signed)
Connor Hill called yesterday 11/7 at 4:26 to report that he is having trouble tolerating the Risperidone.  It is making him too drowsy, dizzy, can't wake up and is being late for work.  He needs to discuss adjustment of this medications or a change.  Appt 12/21.  Please call.

## 2022-07-17 NOTE — Telephone Encounter (Signed)
LVM to rtc 

## 2022-07-17 NOTE — Telephone Encounter (Signed)
Pt stated he started at 1/2 tab for a week then increased to 1.He has been on it a month and a half and does not see any improvement.He stated he thought side effects would resolve but it has been almost 2 months.Even on 1/2 tab he had the same SE.He wants to stop med because he is too tired to function the next day and has been late for work.

## 2022-07-17 NOTE — Telephone Encounter (Signed)
Pt informed

## 2022-07-17 NOTE — Telephone Encounter (Signed)
OK.  Stop risperidone DT SE and no benefit.  If anything gets worse let us know and try to get into an earlier appt.  I don't want to make more med changes over the phone.

## 2022-07-22 ENCOUNTER — Other Ambulatory Visit: Payer: Self-pay | Admitting: Psychiatry

## 2022-07-23 ENCOUNTER — Telehealth: Payer: Self-pay | Admitting: Psychiatry

## 2022-07-23 NOTE — Telephone Encounter (Signed)
Please review.on 11/7 you had him stop Risperdal and stated no more changes over the phone.I will add him to the cancellation list but are there any suggestions outside of the appt on 12/21?

## 2022-07-23 NOTE — Telephone Encounter (Signed)
Call and schedule him for Wed 330

## 2022-07-23 NOTE — Telephone Encounter (Signed)
Please call and schedule him for Wed 330

## 2022-07-23 NOTE — Telephone Encounter (Signed)
Pt called reporting he is very depressed. For last 2 weeks and the past week has increased even more. Extreme lack of motivation. Hasn't shaved, don't even what to pick up the phone. No proactive suicidal thoughts. Feels like ready to just give up and die. Stopped Risperidone 5-6 days ago as directed. Asking if he should increase Fluoxetine? Contact pt @ [phone] Next apt 12/21

## 2022-07-24 NOTE — Telephone Encounter (Signed)
Pt notified apt 3:30 11/15

## 2022-07-25 ENCOUNTER — Encounter: Payer: Self-pay | Admitting: Psychiatry

## 2022-07-25 ENCOUNTER — Telehealth (INDEPENDENT_AMBULATORY_CARE_PROVIDER_SITE_OTHER): Payer: BC Managed Care – PPO | Admitting: Psychiatry

## 2022-07-25 DIAGNOSIS — F22 Delusional disorders: Secondary | ICD-10-CM | POA: Diagnosis not present

## 2022-07-25 DIAGNOSIS — F314 Bipolar disorder, current episode depressed, severe, without psychotic features: Secondary | ICD-10-CM

## 2022-07-25 DIAGNOSIS — F5105 Insomnia due to other mental disorder: Secondary | ICD-10-CM

## 2022-07-25 DIAGNOSIS — F411 Generalized anxiety disorder: Secondary | ICD-10-CM | POA: Diagnosis not present

## 2022-07-25 DIAGNOSIS — F4010 Social phobia, unspecified: Secondary | ICD-10-CM | POA: Diagnosis not present

## 2022-07-25 DIAGNOSIS — F341 Dysthymic disorder: Secondary | ICD-10-CM

## 2022-07-25 MED ORDER — CARIPRAZINE HCL 1.5 MG PO CAPS: 1.5000 mg | ORAL_CAPSULE | Freq: Every day | ORAL | 0 refills | Status: DC

## 2022-07-25 NOTE — Progress Notes (Signed)
Connor Hill [id number] Mar 13, 1996 26 y.o.   Video Visit via My Chart  I connected with pt by video using My Chart and verified that I am speaking with the correct person using two identifiers.   I discussed the limitations, risks, security and privacy concerns of performing an evaluation and management service by My Chart  and the availability of in person appointments. I also discussed with the patient that there may be a patient responsible charge related to this service. The patient expressed understanding and agreed to proceed.  I discussed the assessment and treatment plan with the patient. The patient was provided an opportunity to ask questions and all were answered. The patient agreed with the plan and demonstrated an understanding of the instructions.   The patient was advised to call back or seek an in-person evaluation if the symptoms worsen or if the condition fails to improve as anticipated.  I provided 30 minutes of video time during this encounter.  The patient was located at home and the provider was located office. Session 330-+400pm  Subjective:   Patient ID:  Connor Hill is a 26 y.o. (DOB 01-10-1996) male.  Chief Complaint:  Chief Complaint  Patient presents with   Follow-up    Bipolar II disorder ([place])   Anxiety   Depression   Paranoid    Anxiety Symptoms include nervous/anxious behavior. Patient reports no palpitations.    Depression        Past medical history includes anxiety.    Connor Hill presents to the office today for follow-up of anxiety and depressive symptoms.  At visit in April 2020.  No changes after recent switch to fluoxetine from Lexapro.    visit in June we increased fluoxetine for anxiety and residual depression from 20 to 30 mg daily.  This was helpful.  seen August 2020.  The following changes were made: increase fluoxetine 40 mg for residual anxiety Prn propranolol 20-40  mg social events to see if it helps.  Discussed side effects of each medicine at length.  He would like to try the propranolol.  \seen August 18, 2019.  The following was noted: He returned to the [employer] for school in August. Pretty good overall.  Tried propranolol prn for social settings.  Alcohol with it seemed to increase effects of alcohol.  It did help with social anxiety.   Questions about the meds and medical concerns.  Liver enzymes a little elevated.   Hasn't gotten grades.  Only concern about 1 of the classes which was hard. Increase fluoxetine from 20 to 30 mg huge benefit but 30 to 40 mg less sig benefit  But without SE. Very beneficial. Still No depression.  Anxiety is better but not gone socially Not much other anxiety except some general.  Occ intrusive images of bad things happening. These are better with time also.  A little bit of sweating and a little heart racing.  He tolerated the increase in fluoxetine without side effects. He continued fluoxetine 40 mg daily and as needed propranolol 20 to 40 mg.  01/13/2020 appointment the following is noted: Not using propranolol that much to keep from relying on it. Prozac 40 mg doing really well and he really likes that dose.  Only SE if takes late it bothers his sleep.  May stay awake all night 2 times weekly.  Sleep is erratic and some daytime drowsiness and naps. Patient reports stable mood and denies depressed or irritable  moods.  Patient Occ difficulty with sleep initiation or maintenance. Average Denies appetite disturbance.  Patient reports that energy and motivation have been good.  Patient denies any difficulty with concentration.  Patient denies any suicidal ideation. Plan : no sig med changes  04/18/20 appt with the following noted: Going back to school.  On and off with sadness and moodiness.  Knew he needed to get sober.   Liver labs OK and [place]  Today, but continued abd pain.  Doc said said he needed exam.  Awaiting  results.  Got to thinking drinking is affiliated with GI pain but pain continued even when not drinking.  Trying to find other ways to socialize at 24 is hard.  Can't go out with friends and not drink.  So locking himself in on weekends.  Since quit drinking still some stomach pain but not gone.  Realizes he can't moderate drinking.  All it takes is one drink to jump to too many.  Doesn't need it daily.  When I drink I feel like super man and better verbally.  Without it no urge and now words to talk but anxiety is managed.  Not motivated to talk to people. Propranolol more prn without alcohol. Summer plans still up in the air but will take summer school from undecided location.   Not drinking DT quarantine.  Past alcohol issues with some elevation liver enzymes but he's cut back.  M on SSRI. Plan: More anxiety free lately,  fluoxetine 40 mg daily.  Consider reduce it to 30 mg bc might be a littlle too flat .  Social anxiety was there in the summer at times but not really there now.  ? Dulling him out now.  10/10/2020 appt noted: Graduated.  3 job interviews and considering grad school.  Still living in [place] until decides future. Takes fluoxetine 40 and propranolol 2-3 times per wek very helpful. Almost 5 mos sober now wiehtout alcohol.  Started AA bc couldn't control his drinking.   Doing well.  Anxiety is a lot better.   Is ready to make a adjustment upward in fluoxetine to further reduce social anxiety in large groups.  That flares anxiety. No SE noted. More trouble going to sleep off alcohol.  Asks about sleeper. 2-3 cups coffee daily.  No later than 2 pm. Plan: More anxiety free lately except social situation esp large groups.  Wants increase to  fluoxetine 60 mg daily to see if it can be even better.   12/29/2020 appointment with the following noted: Been amazing with increase Prozac to 60 mg daily.  Propranolol is cherry on top with social and performance anxiety.  "night and day"  difference. Initial SE sleep problems but taking at 1 PM and that seems to solve the problems. Good sleep and energy. Anxiety is manageable now. Depression manageable. Maintained sobriety since October last year and that has helped.  Still doing AA and it's helped.  Going to church a lot in [place] and it has helped get life back on track. 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 Wouldn't have been possible if still drinking. Never needed trazodone. Plan: More anxiety free lately except social situation esp large groups.  Wants increase to  fluoxetine 60 mg daily to see if it can be even better.   08/29/2021 appointment with the following noted: Been good.   Sober 15 mos and best decision ever.  Giving meds a chance to let meds help. Still  get in his head every now and then with different things.  Practicing gratitude helps. M wanted him to ask about some things.  No problem dating but gets really clingy and that's a problem in relationships.  That hits harder than anything and he things it's partly related to He was adopted.  Had some SI late Sept and early Oct bc rejected by woman he was pursuing for another friend of his.  Knew his reaction was out of proportion to the situation. Grounded by church and [employer]. Had SI when drinking but not again until the above. Was so insecure until I got sober.   Plan contiinue fluoxetine 60 mg daily and Trazodone 50-100 mg HS,, prn propranolol  12/09/21 appt noted: expedited appt No trazodone. Today great and peaceful. In December got suddenly more depressed and sad and wanted to withdraw from family.  Scared him.  Had fear he might commit suicide even though he didn't want to do it.  Shocking and not sure why.  Lasted a couple of days then gradually better.  Feb had 1-2  week stretch out of the blue sad, depressed and anxious.  Counselor noticed a neck tic.   SE can't cry. Fall of last year had hyper almost manic period  with much more social and sexual confidence.  Counselor noticed he was almost manic at the time.  Talking to multiple women at the same time.  Can be moody.Says it was because he got sober and confident. Fluoxetine helped the prior obsessive intrusive thoughts of stabbing his father ego dydstonic. Start Depakote ER 500 mg 1 at night for 5 nights then 2 at night..  Disc SE.  01/31/22 appt noted:  Connor Hill reports.taking fluoxetine 60 and Depakote ER 1000. In beginning more sleepy and lazy and getting down.  But then turning point and started feeling better. Very stable but at the same time. Has a GF now, good.   No manic sx since here. Notices if he forgets Depakote will feel off and jittery. Not depressed or anxious now. Since here had episode of intrusive thoughts that people would yell at him for being ugly if went in public, but these thoughts resolved.  Not anxious around people now. Sleep good. SE sexual drive is low.  Used to be be polar opposite.    05/03/22 TC: Patient is taking fluoxetine and Depakote consistently as prescribed. He only takes the propranolol prn. Patient was brighter yesterday. He said he had therapy yesterday, which helped. He is back with the GF. His main concern is the "underlying paranoia." He had misplaced his fluoxetine and was requesting a refill. I told him we could RF but that insurance probably would not cover. He decided he would look for it another day and he was able to find it.    06/12/22 appt noted: Had episode of worsening depression and anxiety with some underlying paranoia.  Had relationship that became toxic and then had SI.  Always had some underlying paranoia with intrusive thoughts of bad things happening to family.  Seeing kinfe and afraid family would fall on it. Ex as soon as had GF then immediately had severe anxiety she would cheat on him to the point of throwing up in the AM.  Broke into her phone and was so positive she would cheat on him.  My  brain had to know that I was right.  At peace with ended relationship 3 days ago. 7th grade was positive world would end in 2012.  Generated a lot of fear.  Willing to admit I am delusional.  Others have commented on his "crazy thoughts".   Started Abilify 7.5 mg with slight relief of crazy thoughts a a little more tranquil.  No SE now. Not as much depression.  Last SI 3-4 weeks.   Shouldn't have cut down on fluoxetine from 60 to 40 mg bc felt worse.  Sexual SE with 60 mg daily was sig.   Plan: It's helped stability so continue Depakote ER 500 mg 2 at night or can split dose.Marland Kitchen  Disc SE. Start Abilify 7.5 mg daily and increase to 15 mg as mood stabilizer and paranoia. Reduce  fluoxetine 40 mg daily because he reports this helped with obsessive harm thoughts which were ego-dystonic but is having sexual SE and it might cause mood cycling.   He may also have OCD.  07/19/22 TC: too much restlessness with Abilify 15 and was told to reduce to 7.5 mg daily. 07/17/22 TC : DT SE changed to risperidone and then had SE drowsy, dizzy.  Was told to stop it. 07/23/22 TC complaining of severe depression and lack of motivation with death thoughts without SI  2022-08-22 appt noted: SE risperidone resolved. 20 days loss motivation and not showering or shaving.  Excessive sleeping.  Hopeless.  No new triggers bc gradually breaking up with GF for a couple of months.  Not going to the gym and used to like doing it.  Anhedonia.  Body is heavy. SI a couple of days ago without plan.  Not now. Hx seasonal depression.     Past Psychiatric Medication Trials: Lexapro sleepy, fluoxetine 60 sexual SE,  Depakote 1000 Abilify 7.5 akathisia Risperidone 1 mg sedation propranolol, trazodone   History counseling after sister died. Adopted at 46 mos old and doesn't know family history.  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  Gastrointestinal:   Negative for abdominal pain.  Neurological:  Negative for tremors and weakness.  Psychiatric/Behavioral:  Negative for agitation and dysphoric mood. The patient is nervous/anxious.     Medications: I have reviewed the patient's current medications.  Current Outpatient Medications  Medication Sig Dispense Refill   Creatine Monohydrate POWD Take by mouth.     Dietary Management Product (NEOKE BCAA4) POWD Take by mouth.     divalproex (DEPAKOTE ER) 500 MG 24 hr tablet TAKE 2 TABLETS BY MOUTH EVERY NIGHT 60 tablet 1   FLUoxetine (PROZAC) 20 MG capsule TAKE 2 CAPSULES BY MOUTH EVERY DAY 120 capsule 0   Linoleic Acid-Sunflower Oil (CLA) [phone] MG CAPS Take by mouth.     propranolol (INDERAL) 20 MG tablet Take 1-2 tablets (20-40 mg total) by mouth 2 (two) times daily as needed. 100 tablet 3   cariprazine (VRAYLAR) 1.5 MG capsule Take 1 capsule (1.5 mg total) by mouth daily. 30 capsule 0   No current facility-administered medications for this visit.    Medication Side Effects: None except less sexual interest  Allergies: No Known Allergies  History reviewed. No pertinent past medical history.  Family History  Adopted: Yes    Social History   Socioeconomic History   Marital status: Unknown    Spouse name: Not on file   Number of children: Not on file   Years of education: Not on file   Highest education level: Not on file  Occupational History   Not on file  Tobacco Use   Smoking status:  Never   Smokeless tobacco: Never  Substance and Sexual Activity   Alcohol use: No   Drug use: No   Sexual activity: Not on file  Other Topics Concern   Not on file  Social History Narrative   Not on file   Social Determinants of Health   Financial Resource Strain: Not on file  Food Insecurity: Not on file  Transportation Needs: Not on file  Physical Activity: Not on file  Stress: Not on file  Social Connections: Not on file  Intimate Partner Violence: Not on file    Past  Medical History, Surgical history, Social history, and Family history were reviewed and updated as appropriate.   Please see review of systems for further details on the patient's review from today.   Objective:   Physical Exam:  There were no vitals taken for this visit.  Physical Exam Constitutional:      General: He is not in acute distress.    Appearance: He is well-developed.  Musculoskeletal:        General: No deformity.  Neurological:     Mental Status: He is alert and oriented to person, place, and time.     Coordination: Coordination normal.  Psychiatric:        Attention and Perception: Attention normal. He is attentive.        Mood and Affect: Mood is not anxious or depressed. Affect is not labile, blunt, angry or inappropriate.        Speech: Speech normal.        Behavior: Behavior normal. Behavior is not aggressive.        Thought Content: Thought content is paranoid. Thought content does not include homicidal or suicidal ideation. Thought content does not include suicidal plan.        Cognition and Memory: Cognition normal.        Judgment: Judgment normal.     Comments: Insight is good. Recent severe depression largely resolved. No mood swings. Talkative with questions without pressure.     Lab Review:  No results found for: "NA", "K", "CL", "CO2", "GLUCOSE", "BUN", "CREATININE", "CALCIUM", "PROT", "ALBUMIN", "AST", "ALT", "ALKPHOS", "BILITOT", "GFRNONAA", "GFRAA"  No results found for: "WBC", "RBC", "HGB", "HCT", "PLT", "MCV", "MCH", "MCHC", "RDW", "LYMPHSABS", "MONOABS", "EOSABS", "BASOSABS"  No results found for: "POCLITH", "LITHIUM"   No results found for: "PHENYTOIN", "PHENOBARB", "VALPROATE", "CBMZ"   .res Assessment: Plan:    Connor Hill was seen today for follow-up, anxiety, depression and paranoid.  Diagnoses and all orders for this visit:  Bipolar I disorder with depression, severe ([place]) -     cariprazine (VRAYLAR) 1.5 MG capsule; Take 1 capsule  (1.5 mg total) by mouth daily.  Paranoia ([facility]) -     cariprazine (VRAYLAR) 1.5 MG capsule; Take 1 capsule (1.5 mg total) by mouth daily.  Generalized anxiety disorder  Social anxiety disorder  Dysthymia  Insomnia due to mental condition  Greater than 50% of 45 min face to face time with patient was spent on counseling and coordination of care. We discussed New likely dx bipolar 2 vs med induced hypomania and rapid cycling of depression since the fall.  Disc this at length and the dx.  Disc his fear of dx of bipolar.  Disc this at length.  Disc mood stabilizer options.  Much better so far with Depakote  He has residual social anxiety which resolved so far.  better manage anxiety socially markedly.  Rec mood stabilizer   BC benefit fluoxetine then ok to  continue but needs mood stabilizer.  BC rapid cycling.  It's helped stability so continue Depakote ER 500 mg 2 at night or can split dose.Marland Kitchen  Disc SE.  Fastest option for severe depression Vraylar 1.5 mg every other day for a week then 1 daily if not better.  Disc treatment plan with eventual plan to reduce the fluoxetine at the right time.    Reduce  fluoxetine 40 mg daily because he reports this helped with obsessive harm thoughts which were ego-dystonic but is having sexual SE and it might cause mood cycling.   He may also have OCD. Answered questions about testosterone and metabolism on SSRI.  Answered questions about weight and SSRI.  He's not craving.  Not working out like he used to bc feels more OK with not working out. Disc sexual SE fluoxetine.  Will plan slow taper.  Prn propranolol 20-40 mg social events it helps.  Discussed side effects of each medicine at length.  Good benefit with it.  Disc dosing range.  Not using it.  Disc DDI with fluoxetine.  Disc SE.  Trazodone no longer needed.  Disc his drinking and he could not moderate and has suffered serious bad situations.  Disc sobriety tools. Sober 5 mos.  Maintain sobriety.     Trazodone 50-100 mg HS, and if fails.  He doesn't want to try Ambien  Supportive therapy dealing with relationship problems and how the history of addiction relates. Continue counseling.  Call if start having more SI episodes and could consider low dose lithium but it seems premature at this time. Work on Best boy, codependency and paranoia.  Disc ways to get right counseling for this.  Has been having substance abuse only.  Discussed safety plan at length with patient.  Advised patient to contact office with any worsening signs and symptoms.  Instructed patient to go to the [facility] emergency room for evaluation if experiencing any acute safety concerns, to include suicidal intent. He commits to safety or will call if worsens.  Follow-up  1 mos .     Lynder Parents, MD, DFAPA   Please see After Visit Summary for patient specific instructions.  Future Appointments  Date Time Provider [place]  08/30/2022 10:30 AM Cottle, Billey Co., MD [facility] None    No orders of the defined types were placed in this encounter.     -------------------------------

## 2022-07-26 ENCOUNTER — Other Ambulatory Visit: Payer: Self-pay | Admitting: Psychiatry

## 2022-07-26 DIAGNOSIS — F3181 Bipolar II disorder: Secondary | ICD-10-CM

## 2022-08-08 ENCOUNTER — Telehealth: Payer: Self-pay | Admitting: Psychiatry

## 2022-08-08 NOTE — Telephone Encounter (Addendum)
Pt called requesting a letter to [facility] on his behalf stating due to episode and issues, he was having.Was a large impact on focus and ability to complete course work.  Advise Pt if willing to complete for him @ [phone]. Any questions contact pt

## 2022-08-10 NOTE — Telephone Encounter (Signed)
Okay to do a letter for pt?

## 2022-08-13 NOTE — Telephone Encounter (Signed)
Yes.  Or I will try to get to it Friday

## 2022-08-17 NOTE — Telephone Encounter (Signed)
I finished the letter.  It might be a stronger letter if it listed exactly what classes were dropped and which semester but I didn't know that info.  Sent letter by email to you.

## 2022-08-17 NOTE — Telephone Encounter (Signed)
Just a reminder...

## 2022-08-20 DIAGNOSIS — N529 Male erectile dysfunction, unspecified: Secondary | ICD-10-CM | POA: Diagnosis not present

## 2022-08-20 DIAGNOSIS — N5319 Other ejaculatory dysfunction: Secondary | ICD-10-CM | POA: Diagnosis not present

## 2022-08-20 DIAGNOSIS — R6882 Decreased libido: Secondary | ICD-10-CM | POA: Diagnosis not present

## 2022-08-20 DIAGNOSIS — F5232 Male orgasmic disorder: Secondary | ICD-10-CM | POA: Diagnosis not present

## 2022-08-22 ENCOUNTER — Other Ambulatory Visit: Payer: Self-pay | Admitting: Psychiatry

## 2022-08-29 NOTE — Telephone Encounter (Addendum)
LVM  Do I need to mail letter?

## 2022-08-30 ENCOUNTER — Telehealth: Payer: Self-pay | Admitting: Psychiatry

## 2022-08-30 ENCOUNTER — Ambulatory Visit: Payer: BC Managed Care – PPO | Admitting: Psychiatry

## 2022-08-30 NOTE — Telephone Encounter (Signed)
Mailed letter for school to Pt's home address [facility] per Pt's request.

## 2022-08-30 NOTE — Telephone Encounter (Signed)
Mailed letter to [facility] address. Per Pt's request

## 2022-08-31 ENCOUNTER — Telehealth: Payer: Self-pay | Admitting: Psychiatry

## 2022-08-31 DIAGNOSIS — F314 Bipolar disorder, current episode depressed, severe, without psychotic features: Secondary | ICD-10-CM

## 2022-08-31 DIAGNOSIS — F22 Delusional disorders: Secondary | ICD-10-CM

## 2022-08-31 NOTE — Telephone Encounter (Signed)
Pt called at 1:44p.  He was supposed to have an appt yesterday with Dr Jennelle Human.  He is experiencing some issues with his medicine that he is concerned about.  Pls call him back.   I apologize I didn't send this message earlier and I was on vacation after the 22nd until yesterday.

## 2022-09-13 NOTE — Telephone Encounter (Signed)
Left a second VM to [facility].  

## 2022-09-14 MED ORDER — CARIPRAZINE HCL 1.5 MG PO CAPS: 1.5000 mg | ORAL_CAPSULE | Freq: Every day | ORAL | 0 refills | Status: DC

## 2022-09-14 NOTE — Telephone Encounter (Signed)
Rx sent and patient notified.

## 2022-09-14 NOTE — Telephone Encounter (Signed)
Patient had called before Christmas, but it did not get routed to clinical. I was able to reach him today. He had an appt scheduled with you in December that was canceled. He said he had run out of his Vraylar. A 30-day supply was sent 07/25/22 and he had not called for a RF. He said he thought it may have just been a temporary medication for him. He was taking it due to akathisia with Abilify. He has been off the medication for a couple of weeks and said he is doing well. He has an appt 1/29. Should he continue the Vraylar at this point?

## 2022-09-14 NOTE — Telephone Encounter (Signed)
Connor Hill gets into the system and out of the system slowly.  So the fact that he has felt well for the last couple of weeks off the Vraylar does not mean that he will continue to feel well until his appointment with me.  My advice is to get back on the Vraylar and stay with it until I see him and then we can make decisions regarding the long term.

## 2022-09-17 ENCOUNTER — Other Ambulatory Visit: Payer: Self-pay | Admitting: Psychiatry

## 2022-09-17 DIAGNOSIS — F4010 Social phobia, unspecified: Secondary | ICD-10-CM

## 2022-09-17 DIAGNOSIS — F341 Dysthymic disorder: Secondary | ICD-10-CM

## 2022-09-17 DIAGNOSIS — F331 Major depressive disorder, recurrent, moderate: Secondary | ICD-10-CM

## 2022-09-17 DIAGNOSIS — F411 Generalized anxiety disorder: Secondary | ICD-10-CM

## 2022-10-04 ENCOUNTER — Other Ambulatory Visit: Payer: Self-pay

## 2022-10-04 ENCOUNTER — Telehealth: Payer: Self-pay | Admitting: Psychiatry

## 2022-10-04 DIAGNOSIS — F3181 Bipolar II disorder: Secondary | ICD-10-CM

## 2022-10-04 MED ORDER — DIVALPROEX SODIUM ER 500 MG PO TB24: ORAL_TABLET | ORAL | 0 refills | Status: DC

## 2022-10-04 NOTE — Telephone Encounter (Signed)
Pt lvm that he needs his depakote er 500 mg sent in today. He took his last pill today. [facility] [place]

## 2022-10-04 NOTE — Telephone Encounter (Signed)
Rx sent 

## 2022-10-08 ENCOUNTER — Encounter: Payer: Self-pay | Admitting: Psychiatry

## 2022-10-08 ENCOUNTER — Telehealth (INDEPENDENT_AMBULATORY_CARE_PROVIDER_SITE_OTHER): Payer: BC Managed Care – PPO | Admitting: Psychiatry

## 2022-10-08 DIAGNOSIS — F4010 Social phobia, unspecified: Secondary | ICD-10-CM | POA: Diagnosis not present

## 2022-10-08 DIAGNOSIS — F314 Bipolar disorder, current episode depressed, severe, without psychotic features: Secondary | ICD-10-CM

## 2022-10-08 DIAGNOSIS — F341 Dysthymic disorder: Secondary | ICD-10-CM

## 2022-10-08 DIAGNOSIS — F3181 Bipolar II disorder: Secondary | ICD-10-CM | POA: Diagnosis not present

## 2022-10-08 DIAGNOSIS — F411 Generalized anxiety disorder: Secondary | ICD-10-CM

## 2022-10-08 DIAGNOSIS — F5105 Insomnia due to other mental disorder: Secondary | ICD-10-CM

## 2022-10-08 DIAGNOSIS — F331 Major depressive disorder, recurrent, moderate: Secondary | ICD-10-CM

## 2022-10-08 DIAGNOSIS — F22 Delusional disorders: Secondary | ICD-10-CM

## 2022-10-08 MED ORDER — DIVALPROEX SODIUM ER 500 MG PO TB24: ORAL_TABLET | ORAL | 0 refills | Status: DC

## 2022-10-08 MED ORDER — CARIPRAZINE HCL 1.5 MG PO CAPS: 1.5000 mg | ORAL_CAPSULE | Freq: Every day | ORAL | 0 refills | Status: DC

## 2022-10-08 MED ORDER — FLUOXETINE HCL 20 MG PO CAPS: 60.0000 mg | ORAL_CAPSULE | Freq: Every day | ORAL | 0 refills | Status: DC

## 2022-10-08 NOTE — Progress Notes (Addendum)
Connor Hill [id number] [birth year] 26 y.o.   Video Visit via My Chart  I connected with pt by video using My Chart and verified that I am speaking with the correct person using two identifiers.   I discussed the limitations, risks, security and privacy concerns of performing an evaluation and management service by My Chart  and the availability of in person appointments. I also discussed with the patient that there may be a patient responsible charge related to this service. The patient expressed understanding and agreed to proceed.  I discussed the assessment and treatment plan with the patient. The patient was provided an opportunity to ask questions and all were answered. The patient agreed with the plan and demonstrated an understanding of the instructions.   The patient was advised to call back or seek an in-person evaluation if the symptoms worsen or if the condition fails to improve as anticipated.  I provided 30 minutes of video time during this encounter.  The patient was located at home and the provider was located office. Session [phone]  Subjective:   Patient ID:  Connor Hill is a 27 y.o. (DOB 03/06/1996) male.  Chief Complaint:  Chief Complaint  Patient presents with   Follow-up   Depression    Anxiety Symptoms include nervous/anxious behavior. Patient reports no palpitations.    Depression        Past medical history includes anxiety.    Connor Hill presents to the office today for follow-up of anxiety and depressive symptoms.  At visit in April 2020.  No changes after recent switch to fluoxetine from Lexapro.    visit in June we increased fluoxetine for anxiety and residual depression from 20 to 30 mg daily.  This was helpful.  seen August 2020.  The following changes were made: increase fluoxetine 40 mg for residual anxiety Prn propranolol 20-40 mg social events to see if it helps.  Discussed side  effects of each medicine at length.  He would like to try the propranolol.  \seen August 18, 2019.  The following was noted: He returned to the [employer] for school in August. Pretty good overall.  Tried propranolol prn for social settings.  Alcohol with it seemed to increase effects of alcohol.  It did help with social anxiety.   Questions about the meds and medical concerns.  Liver enzymes a little elevated.   Hasn't gotten grades.  Only concern about 1 of the classes which was hard. Increase fluoxetine from 20 to 30 mg huge benefit but 30 to 40 mg less sig benefit  But without SE. Very beneficial. Still No depression.  Anxiety is better but not gone socially Not much other anxiety except some general.  Occ intrusive images of bad things happening. These are better with time also.  A little bit of sweating and a little heart racing.  He tolerated the increase in fluoxetine without side effects. He continued fluoxetine 40 mg daily and as needed propranolol 20 to 40 mg.  01/13/2020 appointment the following is noted: Not using propranolol that much to keep from relying on it. Prozac 40 mg doing really well and he really likes that dose.  Only SE if takes late it bothers his sleep.  May stay awake all night 2 times weekly.  Sleep is erratic and some daytime drowsiness and naps. Patient reports stable mood and denies depressed or irritable moods.  Patient Occ difficulty with sleep initiation or maintenance. Average Denies appetite  disturbance.  Patient reports that energy and motivation have been good.  Patient denies any difficulty with concentration.  Patient denies any suicidal ideation. Plan : no sig med changes  04/18/20 appt with the following noted: Going back to school.  On and off with sadness and moodiness.  Knew he needed to get sober.   Liver labs OK and [place]  Today, but continued abd pain.  Doc said said he needed exam.  Awaiting results.  Got to thinking drinking is affiliated with  GI pain but pain continued even when not drinking.  Trying to find other ways to socialize at 24 is hard.  Can't go out with friends and not drink.  So locking himself in on weekends.  Since quit drinking still some stomach pain but not gone.  Realizes he can't moderate drinking.  All it takes is one drink to jump to too many.  Doesn't need it daily.  When I drink I feel like super man and better verbally.  Without it no urge and now words to talk but anxiety is managed.  Not motivated to talk to people. Propranolol more prn without alcohol. Summer plans still up in the air but will take summer school from undecided location.   Not drinking DT quarantine.  Past alcohol issues with some elevation liver enzymes but he's cut back.  M on SSRI. Plan: More anxiety free lately,  fluoxetine 40 mg daily.  Consider reduce it to 30 mg bc might be a littlle too flat .  Social anxiety was there in the summer at times but not really there now.  ? Dulling him out now.  10/10/2020 appt noted: Graduated.  3 job interviews and considering grad school.  Still living in [place] until decides future. Takes fluoxetine 40 and propranolol 2-3 times per wek very helpful. Almost 5 mos sober now wiehtout alcohol.  Started AA bc couldn't control his drinking.   Doing well.  Anxiety is a lot better.   Is ready to make a adjustment upward in fluoxetine to further reduce social anxiety in large groups.  That flares anxiety. No SE noted. More trouble going to sleep off alcohol.  Asks about sleeper. 2-3 cups coffee daily.  No later than 2 pm. Plan: More anxiety free lately except social situation esp large groups.  Wants increase to  fluoxetine 60 mg daily to see if it can be even better.   12/29/2020 appointment with the following noted: Been amazing with increase Prozac to 60 mg daily.  Propranolol is cherry on top with social and performance anxiety.  "night and day" difference. Initial SE sleep problems but taking at 1 PM and  that seems to solve the problems. Good sleep and energy. Anxiety is manageable now. Depression manageable. Maintained sobriety since October last year and that has helped.  Still doing AA and it's helped.  Going to church a lot in [place] and it has helped get life back on track. 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 Wouldn't have been possible if still drinking. Never needed trazodone. Plan: More anxiety free lately except social situation esp large groups.  Wants increase to  fluoxetine 60 mg daily to see if it can be even better.   08/29/2021 appointment with the following noted: Been good.   Sober 15 mos and best decision ever.  Giving meds a chance to let meds help. Still get in his head every now and then with different things.  Practicing  gratitude helps. M wanted him to ask about some things.  No problem dating but gets really clingy and that's a problem in relationships.  That hits harder than anything and he things it's partly related to He was adopted.  Had some SI late Sept and early Oct bc rejected by woman he was pursuing for another friend of his.  Knew his reaction was out of proportion to the situation. Grounded by church and [employer]. Had SI when drinking but not again until the above. Was so insecure until I got sober.   Plan contiinue fluoxetine 60 mg daily and Trazodone 50-100 mg HS,, prn propranolol  12/09/21 appt noted: expedited appt No trazodone. Today great and peaceful. In December got suddenly more depressed and sad and wanted to withdraw from family.  Scared him.  Had fear he might commit suicide even though he didn't want to do it.  Shocking and not sure why.  Lasted a couple of days then gradually better.  Feb had 1-2  week stretch out of the blue sad, depressed and anxious.  Counselor noticed a neck tic.   SE can't cry. Fall of last year had hyper almost manic period with much more social and sexual confidence.  Counselor  noticed he was almost manic at the time.  Talking to multiple women at the same time.  Can be moody.Says it was because he got sober and confident. Fluoxetine helped the prior obsessive intrusive thoughts of stabbing his father ego dydstonic. Start Depakote ER 500 mg 1 at night for 5 nights then 2 at night..  Disc SE.  01/31/22 appt noted:  Connor Hill reports.taking fluoxetine 60 and Depakote ER 1000. In beginning more sleepy and lazy and getting down.  But then turning point and started feeling better. Very stable but at the same time. Has a GF now, good.   No manic sx since here. Notices if he forgets Depakote will feel off and jittery. Not depressed or anxious now. Since here had episode of intrusive thoughts that people would yell at him for being ugly if went in public, but these thoughts resolved.  Not anxious around people now. Sleep good. SE sexual drive is low.  Used to be be polar opposite.    05/03/22 TC: Patient is taking fluoxetine and Depakote consistently as prescribed. He only takes the propranolol prn. Patient was brighter yesterday. He said he had therapy yesterday, which helped. He is back with the GF. His main concern is the "underlying paranoia." He had misplaced his fluoxetine and was requesting a refill. I told him we could RF but that insurance probably would not cover. He decided he would look for it another day and he was able to find it.    06/12/22 appt noted: Had episode of worsening depression and anxiety with some underlying paranoia.  Had relationship that became toxic and then had SI.  Always had some underlying paranoia with intrusive thoughts of bad things happening to family.  Seeing kinfe and afraid family would fall on it. Ex as soon as had GF then immediately had severe anxiety she would cheat on him to the point of throwing up in the AM.  Broke into her phone and was so positive she would cheat on him.  My brain had to know that I was right.  At peace with  ended relationship 3 days ago. 7th grade was positive world would end in 2012.  Generated a lot of fear.  Willing to admit I am delusional.  Others have commented on his "crazy thoughts".   Started Abilify 7.5 mg with slight relief of crazy thoughts a a little more tranquil.  No SE now. Not as much depression.  Last SI 3-4 weeks.   Shouldn't have cut down on fluoxetine from 60 to 40 mg bc felt worse.  Sexual SE with 60 mg daily was sig.   Plan: It's helped stability so continue Depakote ER 500 mg 2 at night or can split dose.Marland Kitchen  Disc SE. Start Abilify 7.5 mg daily and increase to 15 mg as mood stabilizer and paranoia. Reduce  fluoxetine 40 mg daily because he reports this helped with obsessive harm thoughts which were ego-dystonic but is having sexual SE and it might cause mood cycling.   He may also have OCD.  07/19/22 TC: too much restlessness with Abilify 15 and was told to reduce to 7.5 mg daily. 07/17/22 TC : DT SE changed to risperidone and then had SE drowsy, dizzy.  Was told to stop it. 07/23/22 TC complaining of severe depression and lack of motivation with death thoughts without SI  08-09-22 appt noted: SE risperidone resolved. 20 days loss motivation and not showering or shaving.  Excessive sleeping.  Hopeless.  No new triggers bc gradually breaking up with GF for a couple of months.  Not going to the gym and used to like doing it.  Anhedonia.  Body is heavy. SI a couple of days ago without plan.  Not now. Hx seasonal depression.   Plan: It's helped stability so continue Depakote ER 500 mg 2 at night or can split dose.Marland Kitchen  Disc SE. Fastest option for severe depression Vraylar 1.5 mg every other day for a week then 1 daily if not better. Disc treatment plan with eventual plan to reduce the fluoxetine at the right time.   Reduce  fluoxetine 40 mg daily because he reports this helped with obsessive harm thoughts which were ego-dystonic but is having sexual SE and it might cause mood cycling.    He may also have OCD.  09/14/2022 phone call: He ran out of Arman Filter a couple of weeks ago and feels fine and wants to try to stay off of it. MD response:  Arman Filter gets into the system and out of the system slowly.  So the fact that he has felt well for the last couple of weeks off the Vraylar does not mean that he will continue to feel well until his appointment with me.  My advice is to get back on the Vraylar and stay with it until I see him and then we can make decisions regarding the long term.      10/08/2022 appointment noted:  Connor Hill Started feeling more stable without SI and better self care after starting Vraylar.   In the last month has been ok with occ spontaneous moments of depression and as a result not productive today but had been doing pretty well. No SI in 6 weeks which is huge progress bc that period was scary. No SE with Vraylar. Overall anxiety moderated pretty well. No longer having intrusive bad thoughts about things happening to family and no longer feeling paranoid. In nov failed exam bc didn't try bc didn't care.  No longer feels that way and doing much better school. No issues with sleep.  Is a little drowsy.  Avg 9 hours but can be irregular pattern. Thinks he needs to go back to fluoxetine 60 bc still feels sadder than when at 60 vs 40  mg. Seeing urologist [place] and Rx Cialis.   Past Psychiatric Medication Trials: Lexapro sleepy, fluoxetine 60 sexual SE,  Depakote 1000 Abilify 7.5 akathisia Risperidone 1 mg sedation propranolol, trazodone   History counseling after sister died. Adopted at 81 mos old and doesn't know family history.  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  Gastrointestinal:  Negative for abdominal pain.  Neurological:  Negative for tremors and weakness.  Psychiatric/Behavioral:  Positive for dysphoric mood. Negative for agitation. The patient is  nervous/anxious.     Medications: I have reviewed the patient's current medications.  Current Outpatient Medications  Medication Sig Dispense Refill   Creatine Monohydrate POWD Take by mouth.     Dietary Management Product (NEOKE BCAA4) POWD Take by mouth.     Linoleic Acid-Sunflower Oil (CLA) [id number] MG CAPS Take by mouth.     propranolol (INDERAL) 20 MG tablet Take 1-2 tablets (20-40 mg total) by mouth 2 (two) times daily as needed. 100 tablet 3   cariprazine (VRAYLAR) 1.5 MG capsule Take 1 capsule (1.5 mg total) by mouth daily. 90 capsule 0   divalproex (DEPAKOTE ER) 500 MG 24 hr tablet TAKE 2 TABLETS BY MOUTH EVERY NIGHT 180 tablet 0   FLUoxetine (PROZAC) 20 MG capsule Take 3 capsules (60 mg total) by mouth daily. 180 capsule 0   No current facility-administered medications for this visit.    Medication Side Effects: None except less sexual interest  Allergies: No Known Allergies  History reviewed. No pertinent past medical history.  Family History  Adopted: Yes    Social History   Socioeconomic History   Marital status: Unknown    Spouse name: Not on file   Number of children: Not on file   Years of education: Not on file   Highest education level: Not on file  Occupational History   Not on file  Tobacco Use   Smoking status: Never   Smokeless tobacco: Never  Substance and Sexual Activity   Alcohol use: No   Drug use: No   Sexual activity: Not on file  Other Topics Concern   Not on file  Social History Narrative   Not on file   Social Determinants of Health   Financial Resource Strain: Not on file  Food Insecurity: Not on file  Transportation Needs: Not on file  Physical Activity: Not on file  Stress: Not on file  Social Connections: Not on file  Intimate Partner Violence: Not on file    Past Medical History, Surgical history, Social history, and Family history were reviewed and updated as appropriate.   Please see review of systems for further  details on the patient's review from today.   Objective:   Physical Exam:  There were no vitals taken for this visit.  Physical Exam Constitutional:      General: He is not in acute distress.    Appearance: He is well-developed.  Musculoskeletal:        General: No deformity.  Neurological:     Mental Status: He is alert and oriented to person, place, and time.     Coordination: Coordination normal.  Psychiatric:        Attention and Perception: Attention normal. He is attentive.        Mood and Affect: Mood is depressed. Mood is not anxious. Affect is not labile, blunt, angry or inappropriate.        Speech: Speech normal.  Behavior: Behavior normal. Behavior is not aggressive.        Thought Content: Thought content is not paranoid or delusional. Thought content does not include homicidal or suicidal ideation. Thought content does not include suicidal plan.        Cognition and Memory: Cognition normal.        Judgment: Judgment normal.     Comments: Insight is good. Recent severe depression but improved to moderate. No mood swings. Talkative with questions without pressure.     Lab Review:  No results found for: "NA", "K", "CL", "CO2", "GLUCOSE", "BUN", "CREATININE", "CALCIUM", "PROT", "ALBUMIN", "AST", "ALT", "ALKPHOS", "BILITOT", "GFRNONAA", "GFRAA"  No results found for: "WBC", "RBC", "HGB", "HCT", "PLT", "MCV", "MCH", "MCHC", "RDW", "LYMPHSABS", "MONOABS", "EOSABS", "BASOSABS"  No results found for: "POCLITH", "LITHIUM"   No results found for: "PHENYTOIN", "PHENOBARB", "VALPROATE", "CBMZ"   .res Assessment: Plan:    Connor Hill was seen today for follow-up and depression.  Diagnoses and all orders for this visit:  Bipolar I disorder with depression, severe ([facility]) -     cariprazine (VRAYLAR) 1.5 MG capsule; Take 1 capsule (1.5 mg total) by mouth daily.  Generalized anxiety disorder -     FLUoxetine (PROZAC) 20 MG capsule; Take 3 capsules (60 mg total) by mouth  daily.  Social anxiety disorder -     FLUoxetine (PROZAC) 20 MG capsule; Take 3 capsules (60 mg total) by mouth daily.  Dysthymia -     FLUoxetine (PROZAC) 20 MG capsule; Take 3 capsules (60 mg total) by mouth daily.  Paranoia ([facility]) -     cariprazine (VRAYLAR) 1.5 MG capsule; Take 1 capsule (1.5 mg total) by mouth daily.  Insomnia due to mental condition  Major depressive disorder, recurrent episode, moderate ([facility]) -     FLUoxetine (PROZAC) 20 MG capsule; Take 3 capsules (60 mg total) by mouth daily.  Bipolar II disorder ([facility]) -     divalproex (DEPAKOTE ER) 500 MG 24 hr tablet; TAKE 2 TABLETS BY MOUTH EVERY NIGHT  Greater than 50% of 45 min face to face time with patient was spent on counseling and coordination of care. We discussed New likely dx bipolar 2 vs med induced hypomania and rapid cycling of depression since the fall.  Disc this at length and the dx.  Disc his fear of dx of bipolar.  Disc this at length.  Disc mood stabilizer options.  Partially better stability with Depakote but still cycled with periods of severe depression with suicidal thoughts in November 2023.  Suicidal thoughts, intrusive thoughts, anxiety, and paranoia all markedly improved with Vraylar 1.5 mg daily but still dealing with moderate depression.  He has residual social anxiety which resolved so far.  better manage anxiety socially markedly.  Rec mood stabilizer   BC benefit fluoxetine then ok to continue but needs mood stabilizer  BC rapid cycling.  It's helped stability so continue Depakote ER 500 mg 2 at night or can split dose.Marland Kitchen  Disc SE.  for severe depression Vraylar 1.5 mg daily which was partially helpful as noted including resolving suicidal thoughts  increase  fluoxetine back to 60 mg daily because he feels worse with the reduction.    He may also have OCD Answered questions about testosterone and metabolism on SSRI.   Disc sexual SE fluoxetine.  Prn propranolol 20-40 mg social events it  helps.  Discussed side effects of each medicine at length.  Good benefit with it.  Disc dosing range.  Not using it.  Disc  DDI with fluoxetine.  Disc SE.  Trazodone no longer needed.  Disc his drinking and he could not moderate and has suffered serious bad situations.  Disc sobriety tools. Sober several mos.  Maintain sobriety.    Trazodone 50-100 mg HS, and if fails.  He doesn't want to try Ambien  Supportive therapy dealing with relationship problems and how the history of addiction relates. Continue counseling.  Call if start having more SI episodes and could consider low dose lithium but it seems premature at this time. Work on Best boy, codependency and paranoia.  Disc ways to get right counseling for this.  Has been having substance abuse only.  He is no longer suicidal  Follow-up  2 mos .     Lynder Parents, MD, DFAPA   Please see After Visit Summary for patient specific instructions.  No future appointments.   No orders of the defined types were placed in this encounter.     -------------------------------

## 2022-11-02 DIAGNOSIS — K4090 Unilateral inguinal hernia, without obstruction or gangrene, not specified as recurrent: Secondary | ICD-10-CM | POA: Diagnosis not present

## 2022-11-02 DIAGNOSIS — R1011 Right upper quadrant pain: Secondary | ICD-10-CM | POA: Diagnosis not present

## 2022-11-02 DIAGNOSIS — F319 Bipolar disorder, unspecified: Secondary | ICD-10-CM | POA: Diagnosis not present

## 2022-11-02 DIAGNOSIS — F5232 Male orgasmic disorder: Secondary | ICD-10-CM | POA: Diagnosis not present

## 2022-11-02 DIAGNOSIS — Z23 Encounter for immunization: Secondary | ICD-10-CM | POA: Diagnosis not present

## 2022-11-05 ENCOUNTER — Telehealth: Payer: BC Managed Care – PPO | Admitting: Psychiatry

## 2022-11-13 ENCOUNTER — Other Ambulatory Visit: Payer: Self-pay | Admitting: Psychiatry

## 2022-11-13 DIAGNOSIS — F4010 Social phobia, unspecified: Secondary | ICD-10-CM

## 2022-11-13 DIAGNOSIS — F341 Dysthymic disorder: Secondary | ICD-10-CM

## 2022-11-13 DIAGNOSIS — F411 Generalized anxiety disorder: Secondary | ICD-10-CM

## 2022-11-20 DIAGNOSIS — K4090 Unilateral inguinal hernia, without obstruction or gangrene, not specified as recurrent: Secondary | ICD-10-CM | POA: Diagnosis not present

## 2022-12-25 ENCOUNTER — Telehealth (INDEPENDENT_AMBULATORY_CARE_PROVIDER_SITE_OTHER): Payer: BC Managed Care – PPO | Admitting: Psychiatry

## 2022-12-25 ENCOUNTER — Encounter: Payer: Self-pay | Admitting: Psychiatry

## 2022-12-25 DIAGNOSIS — F411 Generalized anxiety disorder: Secondary | ICD-10-CM

## 2022-12-25 DIAGNOSIS — F4010 Social phobia, unspecified: Secondary | ICD-10-CM | POA: Diagnosis not present

## 2022-12-25 DIAGNOSIS — F22 Delusional disorders: Secondary | ICD-10-CM

## 2022-12-25 DIAGNOSIS — F341 Dysthymic disorder: Secondary | ICD-10-CM

## 2022-12-25 DIAGNOSIS — F5105 Insomnia due to other mental disorder: Secondary | ICD-10-CM

## 2022-12-25 DIAGNOSIS — F3181 Bipolar II disorder: Secondary | ICD-10-CM

## 2022-12-25 MED ORDER — VILAZODONE HCL 20 MG PO TABS: ORAL_TABLET | ORAL | 0 refills | Status: DC

## 2022-12-25 NOTE — Progress Notes (Signed)
Connor Hill [id number] Jan 11, 1996 27 y.o.   Video Visit via My Chart  I connected with pt by video using My Chart and verified that I am speaking with the correct person using two identifiers.   I discussed the limitations, risks, security and privacy concerns of performing an evaluation and management service by My Chart  and the availability of in person appointments. I also discussed with the patient that there may be a patient responsible charge related to this service. The patient expressed understanding and agreed to proceed.  I discussed the assessment and treatment plan with the patient. The patient was provided an opportunity to ask questions and all were answered. The patient agreed with the plan and demonstrated an understanding of the instructions.   The patient was advised to call back or seek an in-person evaluation if the symptoms worsen or if the condition fails to improve as anticipated.  I provided 30 minutes of video time during this encounter.  The patient was located at home and the provider was located office. Session [id number]  Subjective:   Patient ID:  Connor Hill is a 27 y.o. (DOB 1995-12-30) male.  Chief Complaint:  No chief complaint on file.   Anxiety Symptoms include nervous/anxious behavior. Patient reports no palpitations.    Depression        Past medical history includes anxiety.    Connor Hill presents to the office today for follow-up of anxiety and depressive symptoms.  At visit in April 2020.  No changes after recent switch to fluoxetine from Lexapro.    visit in June we increased fluoxetine for anxiety and residual depression from 20 to 30 mg daily.  This was helpful.  seen August 2020.  The following changes were made: increase fluoxetine 40 mg for residual anxiety Prn propranolol 20-40 mg social events to see if it helps.  Discussed side effects of each medicine at length.  He  would like to try the propranolol.  \seen August 18, 2019.  The following was noted: He returned to the [employer] [place] for school in August. Pretty good overall.  Tried propranolol prn for social settings.  Alcohol with it seemed to increase effects of alcohol.  It did help with social anxiety.   Questions about the meds and medical concerns.  Liver enzymes a little elevated.   Hasn't gotten grades.  Only concern about 1 of the classes which was hard. Increase fluoxetine from 20 to 30 mg huge benefit but 30 to 40 mg less sig benefit  But without SE. Very beneficial. Still No depression.  Anxiety is better but not gone socially Not much other anxiety except some general.  Occ intrusive images of bad things happening. These are better with time also.  A little bit of sweating and a little heart racing.  He tolerated the increase in fluoxetine without side effects. He continued fluoxetine 40 mg daily and as needed propranolol 20 to 40 mg.  01/13/2020 appointment the following is noted: Not using propranolol that much to keep from relying on it. Prozac 40 mg doing really well and he really likes that dose.  Only SE if takes late it bothers his sleep.  May stay awake all night 2 times weekly.  Sleep is erratic and some daytime drowsiness and naps. Patient reports stable mood and denies depressed or irritable moods.  Patient Occ difficulty with sleep initiation or maintenance. Average Denies appetite disturbance.  Patient reports that energy and motivation  have been good.  Patient denies any difficulty with concentration.  Patient denies any suicidal ideation. Plan : no sig med changes  04/18/20 appt with the following noted: Going back to school.  On and off with sadness and moodiness.  Knew he needed to get sober.   Liver labs OK and [place]  Today, but continued abd pain.  Doc said said he needed exam.  Awaiting results.  Got to thinking drinking is affiliated with GI pain but pain continued even when not  drinking.  Trying to find other ways to socialize at 24 is hard.  Can't go out with friends and not drink.  So locking himself in on weekends.  Since quit drinking still some stomach pain but not gone.  Realizes he can't moderate drinking.  All it takes is one drink to jump to too many.  Doesn't need it daily.  When I drink I feel like super man and better verbally.  Without it no urge and now words to talk but anxiety is managed.  Not motivated to talk to people. Propranolol more prn without alcohol. Summer plans still up in the air but will take summer school from undecided location.   Not drinking DT quarantine.  Past alcohol issues with some elevation liver enzymes but he's cut back.  M on SSRI. Plan: More anxiety free lately,  fluoxetine 40 mg daily.  Consider reduce it to 30 mg bc might be a littlle too flat .  Social anxiety was there in the summer at times but not really there now.  ? Dulling him out now.  10/10/2020 appt noted: Graduated.  3 job interviews and considering grad school.  Still living in [place] until decides future. Takes fluoxetine 40 and propranolol 2-3 times per wek very helpful. Almost 5 mos sober now wiehtout alcohol.  Started AA bc couldn't control his drinking.   Doing well.  Anxiety is a lot better.   Is ready to make a adjustment upward in fluoxetine to further reduce social anxiety in large groups.  That flares anxiety. No SE noted. More trouble going to sleep off alcohol.  Asks about sleeper. 2-3 cups coffee daily.  No later than 2 pm. Plan: More anxiety free lately except social situation esp large groups.  Wants increase to  fluoxetine 60 mg daily to see if it can be even better.   12/29/2020 appointment with the following noted: Been amazing with increase Prozac to 60 mg daily.  Propranolol is cherry on top with social and performance anxiety.  "night and day" difference. Initial SE sleep problems but taking at 1 PM and that seems to solve the problems. Good  sleep and energy. Anxiety is manageable now. Depression manageable. Maintained sobriety since October last year and that has helped.  Still doing AA and it's helped.  Going to church a lot in [place] and it has helped get life back on track. 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 Wouldn't have been possible if still drinking. Never needed trazodone. Plan: More anxiety free lately except social situation esp large groups.  Wants increase to  fluoxetine 60 mg daily to see if it can be even better.   08/29/2021 appointment with the following noted: Been good.   Sober 15 mos and best decision ever.  Giving meds a chance to let meds help. Still get in his head every now and then with different things.  Practicing gratitude helps. M wanted him to ask about  some things.  No problem dating but gets really clingy and that's a problem in relationships.  That hits harder than anything and he things it's partly related to He was adopted.  Had some SI late Sept and early Oct bc rejected by woman he was pursuing for another friend of his.  Knew his reaction was out of proportion to the situation. Grounded by church and [employer]. Had SI when drinking but not again until the above. Was so insecure until I got sober.   Plan contiinue fluoxetine 60 mg daily and Trazodone 50-100 mg HS,, prn propranolol  12/09/21 appt noted: expedited appt No trazodone. Today great and peaceful. In December got suddenly more depressed and sad and wanted to withdraw from family.  Scared him.  Had fear he might commit suicide even though he didn't want to do it.  Shocking and not sure why.  Lasted a couple of days then gradually better.  Feb had 1-2  week stretch out of the blue sad, depressed and anxious.  Counselor noticed a neck tic.   SE can't cry. Fall of last year had hyper almost manic period with much more social and sexual confidence.  Counselor noticed he was almost manic at the time.   Talking to multiple women at the same time.  Can be moody.Says it was because he got sober and confident. Fluoxetine helped the prior obsessive intrusive thoughts of stabbing his father ego dydstonic. Start Depakote ER 500 mg 1 at night for 5 nights then 2 at night..  Disc SE.  01/31/22 appt noted:  Steffanie Dunn reports.taking fluoxetine 60 and Depakote ER 1000. In beginning more sleepy and lazy and getting down.  But then turning point and started feeling better. Very stable but at the same time. Has a GF now, good.   No manic sx since here. Notices if he forgets Depakote will feel off and jittery. Not depressed or anxious now. Since here had episode of intrusive thoughts that people would yell at him for being ugly if went in public, but these thoughts resolved.  Not anxious around people now. Sleep good. SE sexual drive is low.  Used to be be polar opposite.    05/03/22 TC: Patient is taking fluoxetine and Depakote consistently as prescribed. He only takes the propranolol prn. Patient was brighter yesterday. He said he had therapy yesterday, which helped. He is back with the GF. His main concern is the "underlying paranoia." He had misplaced his fluoxetine and was requesting a refill. I told him we could RF but that insurance probably would not cover. He decided he would look for it another day and he was able to find it.    06/12/22 appt noted: Had episode of worsening depression and anxiety with some underlying paranoia.  Had relationship that became toxic and then had SI.  Always had some underlying paranoia with intrusive thoughts of bad things happening to family.  Seeing kinfe and afraid family would fall on it. Ex as soon as had GF then immediately had severe anxiety she would cheat on him to the point of throwing up in the AM.  Broke into her phone and was so positive she would cheat on him.  My brain had to know that I was right.  At peace with ended relationship 3 days ago. 7th grade was  positive world would end in 2012.  Generated a lot of fear.  Willing to admit I am delusional.  Others have commented on his "crazy thoughts".  Started Abilify 7.5 mg with slight relief of crazy thoughts a a little more tranquil.  No SE now. Not as much depression.  Last SI 3-4 weeks.   Shouldn't have cut down on fluoxetine from 60 to 40 mg bc felt worse.  Sexual SE with 60 mg daily was sig.   Plan: It's helped stability so continue Depakote ER 500 mg 2 at night or can split dose.Marland Kitchen  Disc SE. Start Abilify 7.5 mg daily and increase to 15 mg as mood stabilizer and paranoia. Reduce  fluoxetine 40 mg daily because he reports this helped with obsessive harm thoughts which were ego-dystonic but is having sexual SE and it might cause mood cycling.   He may also have OCD.  07/19/22 TC: too much restlessness with Abilify 15 and was told to reduce to 7.5 mg daily. 07/17/22 TC : DT SE changed to risperidone and then had SE drowsy, dizzy.  Was told to stop it. 07/23/22 TC complaining of severe depression and lack of motivation with death thoughts without SI  2022/08/12 appt noted: SE risperidone resolved. 20 days loss motivation and not showering or shaving.  Excessive sleeping.  Hopeless.  No new triggers bc gradually breaking up with GF for a couple of months.  Not going to the gym and used to like doing it.  Anhedonia.  Body is heavy. SI a couple of days ago without plan.  Not now. Hx seasonal depression.   Plan: It's helped stability so continue Depakote ER 500 mg 2 at night or can split dose.Marland Kitchen  Disc SE. Fastest option for severe depression Vraylar 1.5 mg every other day for a week then 1 daily if not better. Disc treatment plan with eventual plan to reduce the fluoxetine at the right time.   Reduce  fluoxetine 40 mg daily because he reports this helped with obsessive harm thoughts which were ego-dystonic but is having sexual SE and it might cause mood cycling.   He may also have OCD.  09/14/2022 phone call:  He ran out of Leafy Kindle a couple of weeks ago and feels fine and wants to try to stay off of it. MD response:  Leafy Kindle gets into the system and out of the system slowly.  So the fact that he has felt well for the last couple of weeks off the Vraylar does not mean that he will continue to feel well until his appointment with me.  My advice is to get back on the Vraylar and stay with it until I see him and then we can make decisions regarding the long term.      10/08/2022 appointment noted:  collier Started feeling more stable without SI and better self care after starting Vraylar.   In the last month has been ok with occ spontaneous moments of depression and as a result not productive today but had been doing pretty well. No SI in 6 weeks which is huge progress bc that period was scary. No SE with Vraylar. Overall anxiety moderated pretty well. No longer having intrusive bad thoughts about things happening to family and no longer feeling paranoid. In nov failed exam bc didn't try bc didn't care.  No longer feels that way and doing much better school. No issues with sleep.  Is a little drowsy.  Avg 9 hours but can be irregular pattern. Thinks he needs to go back to fluoxetine 60 bc still feels sadder than when at 60 vs 40 mg. Seeing urologist [place] and Rx Cialis.  Plan:  Continue Depakote ER 1000 mg nightly, Vraylar 1.5 mg daily,  Increase back to fluoxetine 60 mg daily,  propranolol 20 to 40 mg as needed social anxiety.  12/25/22 appt noted: Good overall.  Some things got in way, breaking up with toxic GF and worst part is behind him.  Did have some SI fleeting but resolved.   Dep 3/10.  Anxiety 6/10.  No anger outbursts except triggered by exGF.   Sees therapist .   Micah Flesher through period of being glued to phone after breakup and panicking.  Over the breakup.  I'm codependent.  Was like giving up drinking again. Sleep is great. No SE now .  Dizziness resolved.   Graduate in May with better GPA  than undergrad.  MA in finance.   Plan to go into investment banking.   Chronic ongoing concern low testosterone and lack sexual libido.  PCP wonders if related to fluoxetine.      Past Psychiatric Medication Trials: Lexapro sleepy, fluoxetine 60 sexual SE,  Depakote 1000 Abilify 7.5 akathisia Risperidone 1 mg sedation Vraylar 1.5 propranolol, trazodone   History counseling after sister died. Adopted at 22 mos old and doesn't know family history.  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  Gastrointestinal:  Negative for abdominal pain.  Neurological:  Negative for tremors and weakness.  Psychiatric/Behavioral:  Positive for dysphoric mood. Negative for agitation. The patient is nervous/anxious.     Medications: I have reviewed the patient's current medications.  Current Outpatient Medications  Medication Sig Dispense Refill   cariprazine (VRAYLAR) 1.5 MG capsule Take 1 capsule (1.5 mg total) by mouth daily. 90 capsule 0   Creatine Monohydrate POWD Take by mouth.     Dietary Management Product (NEOKE BCAA4) POWD Take by mouth.     divalproex (DEPAKOTE ER) 500 MG 24 hr tablet TAKE 2 TABLETS BY MOUTH EVERY NIGHT 180 tablet 0   FLUoxetine (PROZAC) 20 MG capsule Take 3 capsules (60 mg total) by mouth daily. 180 capsule 0   Linoleic Acid-Sunflower Oil (CLA) [phone] MG CAPS Take by mouth.     propranolol (INDERAL) 20 MG tablet Take 1-2 tablets (20-40 mg total) by mouth 2 (two) times daily as needed. 100 tablet 3   No current facility-administered medications for this visit.    Medication Side Effects: None except less sexual interest  Allergies: No Known Allergies  No past medical history on file.  Family History  Adopted: Yes    Social History   Socioeconomic History   Marital status: Unknown    Spouse name: Not on file   Number of children: Not on file   Years of education: Not on file    Highest education level: Not on file  Occupational History   Not on file  Tobacco Use   Smoking status: Never   Smokeless tobacco: Never  Substance and Sexual Activity   Alcohol use: No   Drug use: No   Sexual activity: Not on file  Other Topics Concern   Not on file  Social History Narrative   Not on file   Social Determinants of Health   Financial Resource Strain: Not on file  Food Insecurity: Not on file  Transportation Needs: Not on file  Physical Activity: Not on file  Stress: Not on file  Social Connections: Not on file  Intimate Partner Violence: Not on file    Past Medical History, Surgical history, Social history,  and Family history were reviewed and updated as appropriate.   Please see review of systems for further details on the patient's review from today.   Objective:   Physical Exam:  There were no vitals taken for this visit.  Physical Exam Constitutional:      General: He is not in acute distress.    Appearance: He is well-developed.  Musculoskeletal:        General: No deformity.  Neurological:     Mental Status: He is alert and oriented to person, place, and time.     Coordination: Coordination normal.  Psychiatric:        Attention and Perception: Attention normal. He is attentive.        Mood and Affect: Mood is depressed. Mood is not anxious. Affect is not labile, blunt, angry or inappropriate.        Speech: Speech normal.        Behavior: Behavior normal. Behavior is not aggressive.        Thought Content: Thought content is not paranoid or delusional. Thought content does not include homicidal or suicidal ideation. Thought content does not include suicidal plan.        Cognition and Memory: Cognition normal.        Judgment: Judgment normal.     Comments: Insight is good. Recent severe depression but improved to moderate. No mood swings. Talkative with questions without pressure.     Lab Review:  No results found for: "NA", "K",  "CL", "CO2", "GLUCOSE", "BUN", "CREATININE", "CALCIUM", "PROT", "ALBUMIN", "AST", "ALT", "ALKPHOS", "BILITOT", "GFRNONAA", "GFRAA"  No results found for: "WBC", "RBC", "HGB", "HCT", "PLT", "MCV", "MCH", "MCHC", "RDW", "LYMPHSABS", "MONOABS", "EOSABS", "BASOSABS"  No results found for: "POCLITH", "LITHIUM"   No results found for: "PHENYTOIN", "PHENOBARB", "VALPROATE", "CBMZ"   .res Assessment: Plan:    There are no diagnoses linked to this encounter. Greater than 50% of 45 min face to face time with patient was spent on counseling and coordination of care. We discussed New likely dx bipolar 2 vs med induced hypomania and rapid cycling of depression.  Disc this at length and the dx.  Disc his fear of dx of bipolar.  Disc this at length.  Disc mood stabilizer options.  Partially better stability with Depakote but still cycled with periods of severe depression with suicidal thoughts in November 2023.  Suicidal thoughts, intrusive thoughts, anxiety, and paranoia all markedly improved with Vraylar 1.5 mg daily .  Sx depression currently managed.  He has residual social anxiety which is better manage anxiety socially markedly. However still has sig anxiety  Rec mood stabilizer   BC benefit fluoxetine then ok to continue but needs mood stabilizer  BC rapid cycling.  It's helped stability so continue Depakote ER 500 mg 2 at night or can split dose.Marland Kitchen  Disc SE.  for severe depression Vraylar 1.5 mg daily which was as noted including resolving suicidal thoughts & paranoid thoughts   DT sexual SE fluoxetine best option is to switch to vilazodone.  Start 1/2 tablet vilazodone and reduce fluoxetine to 2 daily for 5-7 days, Then increase vilazodone to 1 tablet daily and reduce fluoxetine to 1 daily for 5-7 days, Then increase vilazodone to 2 tablets (40 mg ) daily and stop fluoxetine .   Prn propranolol 20-40 mg social events it helps.  Discussed side effects of each medicine at length.  Good benefit  with it.  Disc dosing range.  Not using it.  Disc DDI with fluoxetine.  Disc SE.  Trazodone no longer needed.  Disc his drinking and he could not moderate and has suffered serious bad situations.  Disc sobriety tools. Sober several mos.  Maintain sobriety.    Trazodone 50-100 mg HS, and if fails.  He doesn't want to try Ambien  Supportive therapy dealing with relationship problems and how the history of addiction relates. Continue counseling.  Call if start having more SI episodes and could consider low dose lithium but it seems premature at this time. Work on Pharmacist, community, codependency and paranoia.  Disc ways to get right counseling for this.   He is no longer suicidal  Continue Depakote ER 1000 mg nightly, Vraylar 1.5 mg daily, trazodone 50 to 100 mg nightly, propranolol 20 to 40 mg as needed social anxiety.  Follow-up  2 mos .     Meredith Staggers, MD, DFAPA   Please see After Visit Summary for patient specific instructions.  No future appointments.   No orders of the defined types were placed in this encounter.     -------------------------------

## 2023-01-07 ENCOUNTER — Telehealth: Payer: Self-pay | Admitting: Psychiatry

## 2023-01-07 NOTE — Telephone Encounter (Signed)
Pt said he needs a letter written to [facility] for [employer] stating that he's bipolar and has major depression, but that he is seeing Dr Jennelle Human on a regular basis to treat these.  (FYI he also stated he hasn't drank in 2.5 years, but he relapse while he was in [place])  Next appt 7/1

## 2023-01-08 NOTE — Telephone Encounter (Signed)
Dr. Jennelle Human do I need to reach out to him about the letter? Is this something you will be able to do?

## 2023-01-16 ENCOUNTER — Telehealth: Payer: Self-pay | Admitting: Psychiatry

## 2023-01-16 ENCOUNTER — Other Ambulatory Visit: Payer: Self-pay | Admitting: Psychiatry

## 2023-01-16 DIAGNOSIS — F22 Delusional disorders: Secondary | ICD-10-CM

## 2023-01-16 DIAGNOSIS — F3181 Bipolar II disorder: Secondary | ICD-10-CM

## 2023-01-16 MED ORDER — DIVALPROEX SODIUM ER 500 MG PO TB24: ORAL_TABLET | ORAL | 0 refills | Status: DC

## 2023-01-16 MED ORDER — CARIPRAZINE HCL 1.5 MG PO CAPS: 1.5000 mg | ORAL_CAPSULE | Freq: Every day | ORAL | 0 refills | Status: DC

## 2023-01-16 NOTE — Telephone Encounter (Signed)
Pt called and said that he recently moved and he can't find his medicine. So he needs his vraylar 1.5 mg and his depakote er [facility] [address]  [facility] [place]

## 2023-01-16 NOTE — Telephone Encounter (Signed)
Sent!

## 2023-01-17 ENCOUNTER — Telehealth: Payer: Self-pay | Admitting: Psychiatry

## 2023-01-17 NOTE — Telephone Encounter (Signed)
Connor Hill called at 3:35 to report that he went to pick up his Leafy Kindle and was told his insurance won't cover it.  Does it need a PA or is it too soon to fill?  Since he lost his medication in the move it may be too soon.  He has not taken any for 2 days.  If he can't get this medication, is there an alternate.  Please let him know what to do. He has appt by video 6/24

## 2023-01-17 NOTE — Telephone Encounter (Signed)
  Alternative Requested:REQUIRE A STEP THERAPY.   Patient moved and lost his medication. I sent in requested refills and one of them was Vraylar 1.5.  RF bounced back with the above message. I didn't see anything in CMM or any recent notes in Epic where a PA had been done previously.  He is in [place] so can't get samples, but I can send in Rx for a few days and use [facility].   I called pharmacy to ask if it was too early and they repeated the above. It looks like he has been on it since November.

## 2023-01-17 NOTE — Telephone Encounter (Signed)
Called pharmacy to see if it was too early to RF and they said it needed step therapy. Did see pharmacy message with the same information. He has been on this since November. Sent to [facility].

## 2023-01-18 NOTE — Telephone Encounter (Signed)
Prior Approval received for Vraylar 1.5 mg #90 effective through 01/17/2024 with [employer] of [place]

## 2023-02-04 ENCOUNTER — Other Ambulatory Visit: Payer: Self-pay | Admitting: Psychiatry

## 2023-02-04 DIAGNOSIS — F411 Generalized anxiety disorder: Secondary | ICD-10-CM

## 2023-02-04 DIAGNOSIS — F341 Dysthymic disorder: Secondary | ICD-10-CM

## 2023-02-04 DIAGNOSIS — F3181 Bipolar II disorder: Secondary | ICD-10-CM

## 2023-02-06 ENCOUNTER — Other Ambulatory Visit: Payer: Self-pay | Admitting: Psychiatry

## 2023-02-06 DIAGNOSIS — F341 Dysthymic disorder: Secondary | ICD-10-CM

## 2023-02-06 DIAGNOSIS — F3181 Bipolar II disorder: Secondary | ICD-10-CM

## 2023-02-06 DIAGNOSIS — F411 Generalized anxiety disorder: Secondary | ICD-10-CM

## 2023-02-06 MED ORDER — VILAZODONE HCL 40 MG PO TABS: 40.0000 mg | ORAL_TABLET | Freq: Every day | ORAL | 1 refills | Status: AC

## 2023-03-04 ENCOUNTER — Telehealth: Payer: BC Managed Care – PPO | Admitting: Psychiatry

## 2023-03-04 ENCOUNTER — Encounter: Payer: Self-pay | Admitting: Psychiatry

## 2023-03-04 DIAGNOSIS — F411 Generalized anxiety disorder: Secondary | ICD-10-CM | POA: Diagnosis not present

## 2023-03-04 DIAGNOSIS — F4010 Social phobia, unspecified: Secondary | ICD-10-CM

## 2023-03-04 DIAGNOSIS — F341 Dysthymic disorder: Secondary | ICD-10-CM | POA: Diagnosis not present

## 2023-03-04 DIAGNOSIS — F5105 Insomnia due to other mental disorder: Secondary | ICD-10-CM

## 2023-03-04 DIAGNOSIS — F3110 Bipolar disorder, current episode manic without psychotic features, unspecified: Secondary | ICD-10-CM | POA: Diagnosis not present

## 2023-03-04 MED ORDER — SERTRALINE HCL 100 MG PO TABS: 100.0000 mg | ORAL_TABLET | Freq: Every day | ORAL | 0 refills | Status: AC

## 2023-03-04 MED ORDER — DIVALPROEX SODIUM ER 500 MG PO TB24: 1500.0000 mg | ORAL_TABLET | Freq: Every day | ORAL | 0 refills | Status: AC

## 2023-03-04 NOTE — Patient Instructions (Addendum)
Increase Depakote to 3 of 500 mg tablets at night Reduce Viibryd to 1/2 tablet and start sertraline 1/2 tablet daily for 1 week, Then stop Viibryd and increase sertraline to 1 daily.

## 2023-03-04 NOTE — Progress Notes (Signed)
Connor Hill [id number] 1995/11/20 27 y.o.   Video Visit via My Chart  I connected with pt by video using My Chart and verified that I am speaking with the correct person using two identifiers.   I discussed the limitations, risks, security and privacy concerns of performing an evaluation and management service by My Chart  and the availability of in person appointments. I also discussed with the patient that there may be a patient responsible charge related to this service. The patient expressed understanding and agreed to proceed.  I discussed the assessment and treatment plan with the patient. The patient was provided an opportunity to ask questions and all were answered. The patient agreed with the plan and demonstrated an understanding of the instructions.   The patient was advised to call back or seek an in-person evaluation if the symptoms worsen or if the condition fails to improve as anticipated.  I provided 30 minutes of video time during this encounter.  The patient was located at home and the provider was located office. Session 4098-1191  Subjective:   Patient ID:  Connor Hill is a 27 y.o. (DOB 1996-01-27) male.  Chief Complaint:  Chief Complaint  Patient presents with   Follow-up   Depression   Anxiety    Anxiety Symptoms include nervous/anxious behavior. Patient reports no palpitations.    Depression        Past medical history includes anxiety.    Endre Coutts presents to the office today for follow-up of anxiety and depressive symptoms.  At visit in April 2020.  No changes after recent switch to fluoxetine from Lexapro.    visit in June we increased fluoxetine for anxiety and residual depression from 20 to 30 mg daily.  This was helpful.  seen August 2020.  The following changes were made: increase fluoxetine 40 mg for residual anxiety Prn propranolol 20-40 mg social events to see if it helps.   Discussed side effects of each medicine at length.  He would like to try the propranolol.  \seen August 18, 2019.  The following was noted: He returned to the [place] [employer] [place] for school in August. Pretty good overall.  Tried propranolol prn for social settings.  Alcohol with it seemed to increase effects of alcohol.  It did help with social anxiety.   Questions about the meds and medical concerns.  Liver enzymes a little elevated.   Hasn't gotten grades.  Only concern about 1 of the classes which was hard. Increase fluoxetine from 20 to 30 mg huge benefit but 30 to 40 mg less sig benefit  But without SE. Very beneficial. Still No depression.  Anxiety is better but not gone socially Not much other anxiety except some general.  Occ intrusive images of bad things happening. These are better with time also.  A little bit of sweating and a little heart racing.  He tolerated the increase in fluoxetine without side effects. He continued fluoxetine 40 mg daily and as needed propranolol 20 to 40 mg.  01/13/2020 appointment the following is noted: Not using propranolol that much to keep from relying on it. Prozac 40 mg doing really well and he really likes that dose.  Only SE if takes late it bothers his sleep.  May stay awake all night 2 times weekly.  Sleep is erratic and some daytime drowsiness and naps. Patient reports stable mood and denies depressed or irritable moods.  Patient Occ difficulty with sleep initiation or maintenance.  Average Denies appetite disturbance.  Patient reports that energy and motivation have been good.  Patient denies any difficulty with concentration.  Patient denies any suicidal ideation. Plan : no sig med changes  04/18/20 appt with the following noted: Going back to school.  On and off with sadness and moodiness.  Knew he needed to get sober.   Liver labs OK and [place]  Today, but continued abd pain.  Doc said said he needed exam.  Awaiting results.  Got to thinking drinking is  affiliated with GI pain but pain continued even when not drinking.  Trying to find other ways to socialize at 24 is hard.  Can't go out with friends and not drink.  So locking himself in on weekends.  Since quit drinking still some stomach pain but not gone.  Realizes he can't moderate drinking.  All it takes is one drink to jump to too many.  Doesn't need it daily.  When I drink I feel like super man and better verbally.  Without it no urge and now words to talk but anxiety is managed.  Not motivated to talk to people. Propranolol more prn without alcohol. Summer plans still up in the air but will take summer school from undecided location.   Not drinking DT quarantine.  Past alcohol issues with some elevation liver enzymes but he's cut back.  M on SSRI. Plan: More anxiety free lately,  fluoxetine 40 mg daily.  Consider reduce it to 30 mg bc might be a littlle too flat .  Social anxiety was there in the summer at times but not really there now.  ? Dulling him out now.  10/10/2020 appt noted: Graduated.  3 job interviews and considering grad school.  Still living in [place] until decides future. Takes fluoxetine 40 and propranolol 2-3 times per wek very helpful. Almost 5 mos sober now wiehtout alcohol.  Started AA bc couldn't control his drinking.   Doing well.  Anxiety is a lot better.   Is ready to make a adjustment upward in fluoxetine to further reduce social anxiety in large groups.  That flares anxiety. No SE noted. More trouble going to sleep off alcohol.  Asks about sleeper. 2-3 cups coffee daily.  No later than 2 pm. Plan: More anxiety free lately except social situation esp large groups.  Wants increase to  fluoxetine 60 mg daily to see if it can be even better.   12/29/2020 appointment with the following noted: Been amazing with increase Prozac to 60 mg daily.  Propranolol is cherry on top with social and performance anxiety.  "night and day" difference. Initial SE sleep problems but  taking at 1 PM and that seems to solve the problems. Good sleep and energy. Anxiety is manageable now. Depression manageable. Maintained sobriety since October last year and that has helped.  Still doing AA and it's helped.  Going to church a lot in [place] and it has helped get life back on track. 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 Wouldn't have been possible if still drinking. Never needed trazodone. Plan: More anxiety free lately except social situation esp large groups.  Wants increase to  fluoxetine 60 mg daily to see if it can be even better.   08/29/2021 appointment with the following noted: Been good.   Sober 15 mos and best decision ever.  Giving meds a chance to let meds help. Still get in his head every now and then with different  things.  Practicing gratitude helps. M wanted him to ask about some things.  No problem dating but gets really clingy and that's a problem in relationships.  That hits harder than anything and he things it's partly related to He was adopted.  Had some SI late Sept and early Oct bc rejected by woman he was pursuing for another friend of his.  Knew his reaction was out of proportion to the situation. Grounded by church and [employer]. Had SI when drinking but not again until the above. Was so insecure until I got sober.   Plan contiinue fluoxetine 60 mg daily and Trazodone 50-100 mg HS,, prn propranolol  12/09/21 appt noted: expedited appt No trazodone. Today great and peaceful. In December got suddenly more depressed and sad and wanted to withdraw from family.  Scared him.  Had fear he might commit suicide even though he didn't want to do it.  Shocking and not sure why.  Lasted a couple of days then gradually better.  Feb had 1-2  week stretch out of the blue sad, depressed and anxious.  Counselor noticed a neck tic.   SE can't cry. Fall of last year had hyper almost manic period with much more social and sexual  confidence.  Counselor noticed he was almost manic at the time.  Talking to multiple women at the same time.  Can be moody.Says it was because he got sober and confident. Fluoxetine helped the prior obsessive intrusive thoughts of stabbing his father ego dydstonic. Start Depakote ER 500 mg 1 at night for 5 nights then 2 at night..  Disc SE.  01/31/22 appt noted:  Steffanie Dunn reports.taking fluoxetine 60 and Depakote ER 1000. In beginning more sleepy and lazy and getting down.  But then turning point and started feeling better. Very stable but at the same time. Has a GF now, good.   No manic sx since here. Notices if he forgets Depakote will feel off and jittery. Not depressed or anxious now. Since here had episode of intrusive thoughts that people would yell at him for being ugly if went in public, but these thoughts resolved.  Not anxious around people now. Sleep good. SE sexual drive is low.  Used to be be polar opposite.    05/03/22 TC: Patient is taking fluoxetine and Depakote consistently as prescribed. He only takes the propranolol prn. Patient was brighter yesterday. He said he had therapy yesterday, which helped. He is back with the GF. His main concern is the "underlying paranoia." He had misplaced his fluoxetine and was requesting a refill. I told him we could RF but that insurance probably would not cover. He decided he would look for it another day and he was able to find it.    06/12/22 appt noted: Had episode of worsening depression and anxiety with some underlying paranoia.  Had relationship that became toxic and then had SI.  Always had some underlying paranoia with intrusive thoughts of bad things happening to family.  Seeing kinfe and afraid family would fall on it. Ex as soon as had GF then immediately had severe anxiety she would cheat on him to the point of throwing up in the AM.  Broke into her phone and was so positive she would cheat on him.  My brain had to know that I was  right.  At peace with ended relationship 3 days ago. 7th grade was positive world would end in 2012.  Generated a lot of fear.  Willing to admit I  am delusional.  Others have commented on his "crazy thoughts".   Started Abilify 7.5 mg with slight relief of crazy thoughts a a little more tranquil.  No SE now. Not as much depression.  Last SI 3-4 weeks.   Shouldn't have cut down on fluoxetine from 60 to 40 mg bc felt worse.  Sexual SE with 60 mg daily was sig.   Plan: It's helped stability so continue Depakote ER 500 mg 2 at night or can split dose.Marland Kitchen  Disc SE. Start Abilify 7.5 mg daily and increase to 15 mg as mood stabilizer and paranoia. Reduce  fluoxetine 40 mg daily because he reports this helped with obsessive harm thoughts which were ego-dystonic but is having sexual SE and it might cause mood cycling.   He may also have OCD.  07/19/22 TC: too much restlessness with Abilify 15 and was told to reduce to 7.5 mg daily. 07/17/22 TC : DT SE changed to risperidone and then had SE drowsy, dizzy.  Was told to stop it. 07/23/22 TC complaining of severe depression and lack of motivation with death thoughts without SI  2022-08-01 appt noted: SE risperidone resolved. 20 days loss motivation and not showering or shaving.  Excessive sleeping.  Hopeless.  No new triggers bc gradually breaking up with GF for a couple of months.  Not going to the gym and used to like doing it.  Anhedonia.  Body is heavy. SI a couple of days ago without plan.  Not now. Hx seasonal depression.   Plan: It's helped stability so continue Depakote ER 500 mg 2 at night or can split dose.Marland Kitchen  Disc SE. Fastest option for severe depression Vraylar 1.5 mg every other day for a week then 1 daily if not better. Disc treatment plan with eventual plan to reduce the fluoxetine at the right time.   Reduce  fluoxetine 40 mg daily because he reports this helped with obsessive harm thoughts which were ego-dystonic but is having sexual SE and it might  cause mood cycling.   He may also have OCD.  09/14/2022 phone call: He ran out of Leafy Kindle a couple of weeks ago and feels fine and wants to try to stay off of it. MD response:  Leafy Kindle gets into the system and out of the system slowly.  So the fact that he has felt well for the last couple of weeks off the Vraylar does not mean that he will continue to feel well until his appointment with me.  My advice is to get back on the Vraylar and stay with it until I see him and then we can make decisions regarding the long term.     10/08/2022 appointment noted:  collier Started feeling more stable without SI and better self care after starting Vraylar.   In the last month has been ok with occ spontaneous moments of depression and as a result not productive today but had been doing pretty well. No SI in 6 weeks which is huge progress bc that period was scary. No SE with Vraylar. Overall anxiety moderated pretty well. No longer having intrusive bad thoughts about things happening to family and no longer feeling paranoid. In nov failed exam bc didn't try bc didn't care.  No longer feels that way and doing much better school. No issues with sleep.  Is a little drowsy.  Avg 9 hours but can be irregular pattern. Thinks he needs to go back to fluoxetine 60 bc still feels sadder than when at 60  vs 40 mg. Seeing urologist [place] and Rx Cialis.  Plan: Continue Depakote ER 1000 mg nightly, Vraylar 1.5 mg daily,  Increase back to fluoxetine 60 mg daily,  propranolol 20 to 40 mg as needed social anxiety.  12/25/22 appt noted: Good overall.  Some things got in way, breaking up with toxic GF and worst part is behind him.  Did have some SI fleeting but resolved.   Dep 3/10.  Anxiety 6/10.  No anger outbursts except triggered by exGF.   Sees therapist .   Connor Hill through period of being glued to phone after breakup and panicking.  Over the breakup.  I'm codependent.  Was like giving up drinking again. Sleep is great. No  SE now .  Dizziness resolved.   Graduate in May with better GPA than undergrad.  MA in finance.   Plan to go into investment banking.   Chronic ongoing concern low testosterone and lack sexual libido.  PCP wonders if related to fluoxetine.    03/04/23 appt noted: Switched from fluoxetine to Viibryd with less sex SE.  Not sexually active. A lot happened in life. Seeing a therapist.   Relapsed with alcohol after 2 and 1/2 years.  Kind of went crazy.  Got Ex GF crisis. This made him more manic.  Loves the high and feeling of superiority.  Therapist told him to contact us.   Feels he has come out of it in the last week.  Prayer helped him.  More surrender.  Just picked up one month sober chip.  Didn't crash hard after mania this time.  But has felt the high resolved.   Almost kicked out of grad school DT alcohol. More social anxiety with switch to Viibryd.    Past Psychiatric Medication Trials:  Lexapro sleepy, fluoxetine 60 sexual SE,  Viibryd Depakote 1000 Abilify 7.5 akathisia Risperidone 1 mg sedation Vraylar 1.5 propranolol,  trazodone   History counseling after sister died. Adopted at 71 mos old and doesn't know family history.  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  Gastrointestinal:  Negative for abdominal pain.  Neurological:  Negative for tremors and weakness.  Psychiatric/Behavioral:  Positive for dysphoric mood. Negative for agitation. The patient is nervous/anxious.     Medications: I have reviewed the patient's current medications.  Current Outpatient Medications  Medication Sig Dispense Refill   Creatine Monohydrate POWD Take by mouth.     Dietary Management Product (NEOKE BCAA4) POWD Take by mouth.     divalproex (DEPAKOTE ER) 500 MG 24 hr tablet TAKE 2 TABLETS BY MOUTH EVERY NIGHT 180 tablet 0   Linoleic Acid-Sunflower Oil (CLA) [phone] MG CAPS Take by mouth.     propranolol  (INDERAL) 20 MG tablet Take 1-2 tablets (20-40 mg total) by mouth 2 (two) times daily as needed. 100 tablet 3   Vilazodone HCl (VIIBRYD) 40 MG TABS Take 1 tablet (40 mg total) by mouth daily. 30 tablet 1   VRAYLAR 1.5 MG capsule TAKE 1 CAPSULE BY MOUTH DAILY. 90 capsule 0   FLUoxetine (PROZAC) 20 MG capsule Take 3 capsules (60 mg total) by mouth daily. (Patient not taking: Reported on 03/04/2023) 180 capsule 0   No current facility-administered medications for this visit.    Medication Side Effects: None except less sexual interest  Allergies: No Known Allergies  History reviewed. No pertinent past medical history.  Family History  Adopted: Yes    Social History  Socioeconomic History   Marital status: Unknown    Spouse name: Not on file   Number of children: Not on file   Years of education: Not on file   Highest education level: Not on file  Occupational History   Not on file  Tobacco Use   Smoking status: Never   Smokeless tobacco: Never  Substance and Sexual Activity   Alcohol use: No   Drug use: No   Sexual activity: Not on file  Other Topics Concern   Not on file  Social History Narrative   Not on file   Social Determinants of Health   Financial Resource Strain: Not on file  Food Insecurity: Not on file  Transportation Needs: Not on file  Physical Activity: Not on file  Stress: Not on file  Social Connections: Not on file  Intimate Partner Violence: Not on file    Past Medical History, Surgical history, Social history, and Family history were reviewed and updated as appropriate.   Please see review of systems for further details on the patient's review from today.   Objective:   Physical Exam:  There were no vitals taken for this visit.  Physical Exam Constitutional:      General: He is not in acute distress.    Appearance: He is well-developed.  Musculoskeletal:        General: No deformity.  Neurological:     Mental Status: He is alert and  oriented to person, place, and time.     Coordination: Coordination normal.  Psychiatric:        Attention and Perception: Attention normal. He is attentive.        Mood and Affect: Mood is depressed. Mood is not anxious. Affect is not labile, blunt, angry or inappropriate.        Speech: Speech normal.        Behavior: Behavior normal. Behavior is not aggressive.        Thought Content: Thought content is not paranoid or delusional. Thought content does not include homicidal or suicidal ideation. Thought content does not include suicidal plan.        Cognition and Memory: Cognition normal.        Judgment: Judgment normal.     Comments: Insight is good. Recent severe depression but improved to moderate. No mood swings. Talkative with questions without pressure.     Lab Review:  No results found for: "NA", "K", "CL", "CO2", "GLUCOSE", "BUN", "CREATININE", "CALCIUM", "PROT", "ALBUMIN", "AST", "ALT", "ALKPHOS", "BILITOT", "GFRNONAA", "GFRAA"  No results found for: "WBC", "RBC", "HGB", "HCT", "PLT", "MCV", "MCH", "MCHC", "RDW", "LYMPHSABS", "MONOABS", "EOSABS", "BASOSABS"  No results found for: "POCLITH", "LITHIUM"   No results found for: "PHENYTOIN", "PHENOBARB", "VALPROATE", "CBMZ"   .res Assessment: Plan:    Brylan was seen today for follow-up, depression and anxiety.  Diagnoses and all orders for this visit:  Bipolar I disorder, most recent episode (or current) manic (HCC)  Generalized anxiety disorder  Social anxiety disorder  Dysthymia  Insomnia due to mental condition  30 min video face to face time with patient was spent on counseling and coordination of care. We discussed New likely dx bipolar 2 vs med induced hypomania and rapid cycling of depression.  Disc this at length and the dx.  Disc his fear of dx of bipolar.  Disc this at length.  Disc mood stabilizer options.  Partially better stability with Depakote but still cycled with periods of severe depression with  suicidal thoughts in November 2023.  Suicidal thoughts, intrusive thoughts, anxiety, and paranoia all markedly improved with Vraylar 1.5 mg daily   He has residual social anxiety which is worse with switch to Viibryd.  Disc alternatives .  Sertraline FDA approved for it.  Disc risk mania with SSRI  He understands need for mood stabilizer.    Increase Depakote ER 500 mg 3 at night for recent mania.  Disc SE.  for severe depression Vraylar 1.5 mg daily which was as noted including resolving suicidal thoughts & paranoid thoughts  Prn propranolol 20-40 mg social events it helps.  Discussed side effects of each medicine at length.  Good benefit with it.  Disc dosing range.  Not using it.  Disc DDI with fluoxetine.  Disc SE.  Trazodone no longer needed.  Disc his drinking and he could not moderate and has suffered serious bad situations.  Disc sobriety tools. Sober several mos.  Maintain sobriety.  He plans to continue AA  Trazodone 50-100 mg HS, and if fails.  He doesn't want to try Ambien  Supportive therapy dealing with relationship problems and how the history of addiction relates. Continue counseling.  Call if start having more SI episodes and could consider low dose lithium but it seems premature at this time. Work on Pharmacist, community, codependency and paranoia.  Disc ways to get right counseling for this.   He is no longer suicidal  Continue Vraylar 1.5 mg daily, trazodone 50 to 100 mg nightly, propranolol 20 to 40 mg as needed social anxiety.  Med changes: Increase Depakote to 3 of 500 mg tablets at night Reduce Viibryd to 1/2 tablet and start sertraline 1/2 tablet daily for 1 week, Then stop Viibryd and increase sertraline to 1 daily.  Follow-up  4-6 weeks   Meredith Staggers, MD, DFAPA   Please see After Visit Summary for patient specific instructions.  No future appointments.   No orders of the defined types were placed in this encounter.     -------------------------------

## 2023-03-05 ENCOUNTER — Other Ambulatory Visit: Payer: Self-pay | Admitting: Psychiatry

## 2023-03-05 DIAGNOSIS — F4010 Social phobia, unspecified: Secondary | ICD-10-CM

## 2023-03-05 DIAGNOSIS — F411 Generalized anxiety disorder: Secondary | ICD-10-CM

## 2023-03-05 DIAGNOSIS — F341 Dysthymic disorder: Secondary | ICD-10-CM

## 2023-03-11 ENCOUNTER — Telehealth: Payer: BC Managed Care – PPO | Admitting: Psychiatry

## 2023-04-04 ENCOUNTER — Telehealth: Payer: Self-pay | Admitting: Psychiatry

## 2023-04-04 NOTE — Telephone Encounter (Signed)
From 6/24 visit:  Increase Depakote to 3 of 500 mg tablets at night Reduce Viibryd to 1/2 tablet and start sertraline 1/2 tablet daily for 1 week, Then stop Viibryd and increase sertraline to 1 daily.  LVM to [facility] tomorrow.

## 2023-04-04 NOTE — Telephone Encounter (Signed)
Connor Hill called and LM at 3:31 reporting severe depression and mood swings.  Said medication was changed some and Dr. Jennelle Human told him he could get an expediate appt to see and is requesting that appt.  It is ok to get him on a Friday, soon?

## 2023-04-05 NOTE — Telephone Encounter (Signed)
Left second VM to RC.  

## 2023-04-08 NOTE — Telephone Encounter (Signed)
Patient notified of recommendation.

## 2023-04-08 NOTE — Telephone Encounter (Signed)
Left third VM to RC.

## 2023-04-08 NOTE — Telephone Encounter (Signed)
First thing to do is reduce the sertraline.  Sounds like he is getting too much sertraline.  It is blunting and sedating him.  Reduce to 1/2 tablet sertraline and call back in a week to see if it is better.  Then we will decide about an appt.

## 2023-05-06 ENCOUNTER — Other Ambulatory Visit: Payer: Self-pay | Admitting: Psychiatry

## 2023-05-06 DIAGNOSIS — F22 Delusional disorders: Secondary | ICD-10-CM

## 2023-05-06 DIAGNOSIS — F3181 Bipolar II disorder: Secondary | ICD-10-CM

## 2023-05-06 NOTE — Telephone Encounter (Signed)
Needs to schedule follow up;

## 2023-05-06 NOTE — Telephone Encounter (Signed)
Usually uses pharmacy on [address]. LVM to [facility] to verify pharmacy.

## 2023-05-17 DIAGNOSIS — F064 Anxiety disorder due to known physiological condition: Secondary | ICD-10-CM | POA: Diagnosis not present

## 2023-05-17 DIAGNOSIS — F322 Major depressive disorder, single episode, severe without psychotic features: Secondary | ICD-10-CM | POA: Diagnosis not present

## 2023-05-17 DIAGNOSIS — F900 Attention-deficit hyperactivity disorder, predominantly inattentive type: Secondary | ICD-10-CM | POA: Diagnosis not present

## 2023-05-17 DIAGNOSIS — F10182 Alcohol abuse with alcohol-induced sleep disorder: Secondary | ICD-10-CM | POA: Diagnosis not present

## 2023-05-24 NOTE — Telephone Encounter (Signed)
Patient rtc stating that he is now living in [facility] and has found a new provider

## 2023-05-24 NOTE — Telephone Encounter (Signed)
Lvm to schedule f/u

## 2023-05-27 NOTE — Telephone Encounter (Signed)
Please close his chart with no more refills.  Moved to [facility] and has anew provider.

## 2023-05-30 ENCOUNTER — Other Ambulatory Visit: Payer: Self-pay | Admitting: Psychiatry

## 2023-05-30 DIAGNOSIS — F411 Generalized anxiety disorder: Secondary | ICD-10-CM

## 2023-05-30 DIAGNOSIS — F4010 Social phobia, unspecified: Secondary | ICD-10-CM

## 2023-05-30 NOTE — Telephone Encounter (Signed)
Has a new provider

## 2023-07-12 DIAGNOSIS — K4090 Unilateral inguinal hernia, without obstruction or gangrene, not specified as recurrent: Secondary | ICD-10-CM | POA: Diagnosis not present

## 2023-07-12 DIAGNOSIS — Z6836 Body mass index (BMI) 36.0-36.9, adult: Secondary | ICD-10-CM | POA: Diagnosis not present

## 2023-07-12 DIAGNOSIS — Z23 Encounter for immunization: Secondary | ICD-10-CM | POA: Diagnosis not present

## 2023-07-12 DIAGNOSIS — Z79899 Other long term (current) drug therapy: Secondary | ICD-10-CM | POA: Diagnosis not present

## 2023-07-12 DIAGNOSIS — Z0000 Encounter for general adult medical examination without abnormal findings: Secondary | ICD-10-CM | POA: Diagnosis not present

## 2023-07-12 DIAGNOSIS — F3341 Major depressive disorder, recurrent, in partial remission: Secondary | ICD-10-CM | POA: Diagnosis not present

## 2023-07-12 DIAGNOSIS — E119 Type 2 diabetes mellitus without complications: Secondary | ICD-10-CM | POA: Diagnosis not present
